# Patient Record
Sex: Female | Born: 1950 | Race: White | Hispanic: No | Marital: Married | Smoking: Never smoker
Health system: Southern US, Community
[De-identification: ages and names within clinical notes are randomized; demographics above are authoritative.]

## PROBLEM LIST (undated history)

## (undated) DIAGNOSIS — M12819 Other specific arthropathies, not elsewhere classified, unspecified shoulder: Secondary | ICD-10-CM

## (undated) DIAGNOSIS — Z973 Presence of spectacles and contact lenses: Secondary | ICD-10-CM

## (undated) DIAGNOSIS — R1310 Dysphagia, unspecified: Secondary | ICD-10-CM

## (undated) DIAGNOSIS — M75100 Unspecified rotator cuff tear or rupture of unspecified shoulder, not specified as traumatic: Secondary | ICD-10-CM

## (undated) DIAGNOSIS — C187 Malignant neoplasm of sigmoid colon: Secondary | ICD-10-CM

## (undated) DIAGNOSIS — R1110 Vomiting, unspecified: Secondary | ICD-10-CM

## (undated) DIAGNOSIS — R499 Unspecified voice and resonance disorder: Secondary | ICD-10-CM

## (undated) DIAGNOSIS — C210 Malignant neoplasm of anus, unspecified: Secondary | ICD-10-CM

## (undated) DIAGNOSIS — K219 Gastro-esophageal reflux disease without esophagitis: Secondary | ICD-10-CM

## (undated) DIAGNOSIS — I10 Essential (primary) hypertension: Secondary | ICD-10-CM

## (undated) DIAGNOSIS — J189 Pneumonia, unspecified organism: Secondary | ICD-10-CM

## (undated) DIAGNOSIS — E785 Hyperlipidemia, unspecified: Secondary | ICD-10-CM

## (undated) DIAGNOSIS — R053 Chronic cough: Secondary | ICD-10-CM

## (undated) DIAGNOSIS — R350 Frequency of micturition: Secondary | ICD-10-CM

## (undated) DIAGNOSIS — M1990 Unspecified osteoarthritis, unspecified site: Secondary | ICD-10-CM

## (undated) DIAGNOSIS — C801 Malignant (primary) neoplasm, unspecified: Secondary | ICD-10-CM

## (undated) DIAGNOSIS — I509 Heart failure, unspecified: Secondary | ICD-10-CM

## (undated) DIAGNOSIS — G629 Polyneuropathy, unspecified: Secondary | ICD-10-CM

## (undated) DIAGNOSIS — R05 Cough: Secondary | ICD-10-CM

## (undated) DIAGNOSIS — G44009 Cluster headache syndrome, unspecified, not intractable: Secondary | ICD-10-CM

## (undated) HISTORY — DX: Malignant (primary) neoplasm, unspecified: C80.1

## (undated) HISTORY — PX: COLON SURGERY: SHX602

## (undated) HISTORY — DX: Hyperlipidemia, unspecified: E78.5

## (undated) HISTORY — DX: Gastro-esophageal reflux disease without esophagitis: K21.9

## (undated) HISTORY — DX: Unspecified voice and resonance disorder: R49.9

## (undated) HISTORY — DX: Cluster headache syndrome, unspecified, not intractable: G44.009

## (undated) HISTORY — PX: CHOLECYSTECTOMY: SHX55

## (undated) HISTORY — PX: EYE SURGERY: SHX253

## (undated) HISTORY — DX: Essential (primary) hypertension: I10

## (undated) HISTORY — DX: Malignant neoplasm of anus, unspecified: C21.0

## (undated) HISTORY — DX: Chronic cough: R05.3

## (undated) HISTORY — DX: Malignant neoplasm of sigmoid colon: C18.7

## (undated) HISTORY — DX: Heart failure, unspecified: I50.9

## (undated) HISTORY — DX: Cough: R05

## (undated) HISTORY — PX: HERNIA REPAIR: SHX51

## (undated) HISTORY — DX: Unspecified osteoarthritis, unspecified site: M19.90

## (undated) HISTORY — DX: Vomiting, unspecified: R11.10

## (undated) HISTORY — DX: Dysphagia, unspecified: R13.10

## (undated) HISTORY — PX: ABDOMINAL HYSTERECTOMY: SHX81

---

## 1989-06-11 DIAGNOSIS — C210 Malignant neoplasm of anus, unspecified: Secondary | ICD-10-CM

## 1989-06-11 HISTORY — DX: Malignant neoplasm of anus, unspecified: C21.0

## 1990-06-11 DIAGNOSIS — C187 Malignant neoplasm of sigmoid colon: Secondary | ICD-10-CM

## 1990-06-11 HISTORY — DX: Malignant neoplasm of sigmoid colon: C18.7

## 2001-02-25 ENCOUNTER — Encounter: Payer: Self-pay | Admitting: Emergency Medicine

## 2001-02-25 ENCOUNTER — Emergency Department (HOSPITAL_COMMUNITY): Admission: EM | Admit: 2001-02-25 | Discharge: 2001-02-25 | Payer: Self-pay | Admitting: Emergency Medicine

## 2003-02-25 ENCOUNTER — Encounter: Payer: Self-pay | Admitting: Urology

## 2003-02-25 ENCOUNTER — Ambulatory Visit (HOSPITAL_COMMUNITY): Admission: RE | Admit: 2003-02-25 | Discharge: 2003-02-25 | Payer: Self-pay | Admitting: Urology

## 2003-04-02 ENCOUNTER — Encounter: Payer: Self-pay | Admitting: Preventative Medicine

## 2003-04-02 ENCOUNTER — Ambulatory Visit (HOSPITAL_COMMUNITY): Admission: RE | Admit: 2003-04-02 | Discharge: 2003-04-02 | Payer: Self-pay | Admitting: Preventative Medicine

## 2003-08-22 ENCOUNTER — Emergency Department (HOSPITAL_COMMUNITY): Admission: EM | Admit: 2003-08-22 | Discharge: 2003-08-22 | Payer: Self-pay | Admitting: Emergency Medicine

## 2005-04-03 ENCOUNTER — Ambulatory Visit (HOSPITAL_COMMUNITY): Admission: RE | Admit: 2005-04-03 | Discharge: 2005-04-03 | Payer: Self-pay | Admitting: Preventative Medicine

## 2005-04-30 ENCOUNTER — Encounter: Admission: RE | Admit: 2005-04-30 | Discharge: 2005-04-30 | Payer: Self-pay | Admitting: Neurology

## 2005-05-18 ENCOUNTER — Emergency Department (HOSPITAL_COMMUNITY): Admission: EM | Admit: 2005-05-18 | Discharge: 2005-05-18 | Payer: Self-pay | Admitting: *Deleted

## 2005-11-10 ENCOUNTER — Emergency Department (HOSPITAL_COMMUNITY): Admission: EM | Admit: 2005-11-10 | Discharge: 2005-11-10 | Payer: Self-pay | Admitting: Emergency Medicine

## 2006-03-11 ENCOUNTER — Ambulatory Visit (HOSPITAL_COMMUNITY): Admission: RE | Admit: 2006-03-11 | Discharge: 2006-03-11 | Payer: Self-pay | Admitting: Family Medicine

## 2006-11-20 ENCOUNTER — Inpatient Hospital Stay (HOSPITAL_COMMUNITY): Admission: RE | Admit: 2006-11-20 | Discharge: 2006-11-24 | Payer: Self-pay | Admitting: Neurosurgery

## 2007-02-27 ENCOUNTER — Encounter (HOSPITAL_COMMUNITY): Admission: RE | Admit: 2007-02-27 | Discharge: 2007-03-11 | Payer: Self-pay | Admitting: Neurosurgery

## 2008-05-24 ENCOUNTER — Encounter: Payer: Self-pay | Admitting: Orthopedic Surgery

## 2008-05-24 ENCOUNTER — Ambulatory Visit (HOSPITAL_COMMUNITY): Admission: RE | Admit: 2008-05-24 | Discharge: 2008-05-24 | Payer: Self-pay | Admitting: Family Medicine

## 2008-05-26 ENCOUNTER — Ambulatory Visit: Payer: Self-pay | Admitting: Orthopedic Surgery

## 2008-05-26 DIAGNOSIS — S8000XA Contusion of unspecified knee, initial encounter: Secondary | ICD-10-CM

## 2008-06-01 ENCOUNTER — Encounter: Payer: Self-pay | Admitting: Orthopedic Surgery

## 2008-06-11 HISTORY — PX: BACK SURGERY: SHX140

## 2008-09-01 ENCOUNTER — Inpatient Hospital Stay (HOSPITAL_COMMUNITY): Admission: RE | Admit: 2008-09-01 | Discharge: 2008-09-04 | Payer: Self-pay | Admitting: Neurosurgery

## 2008-11-09 ENCOUNTER — Ambulatory Visit (HOSPITAL_COMMUNITY): Admission: RE | Admit: 2008-11-09 | Discharge: 2008-11-09 | Payer: Self-pay | Admitting: Neurosurgery

## 2008-12-22 ENCOUNTER — Encounter: Admission: RE | Admit: 2008-12-22 | Discharge: 2008-12-22 | Payer: Self-pay | Admitting: Gastroenterology

## 2010-03-27 ENCOUNTER — Encounter: Admission: RE | Admit: 2010-03-27 | Discharge: 2010-03-27 | Payer: Self-pay | Admitting: Gastroenterology

## 2010-05-04 IMAGING — CR DG CHEST 2V
2 series · 2 of 2 positions shown · non-contrast
Comparison: 11/14/2006.

CLINICAL DATA: 58-year-old female preoperative study for lumbar
fusion.

CHEST - 2 VIEW

[view not recorded (1 of 2)]
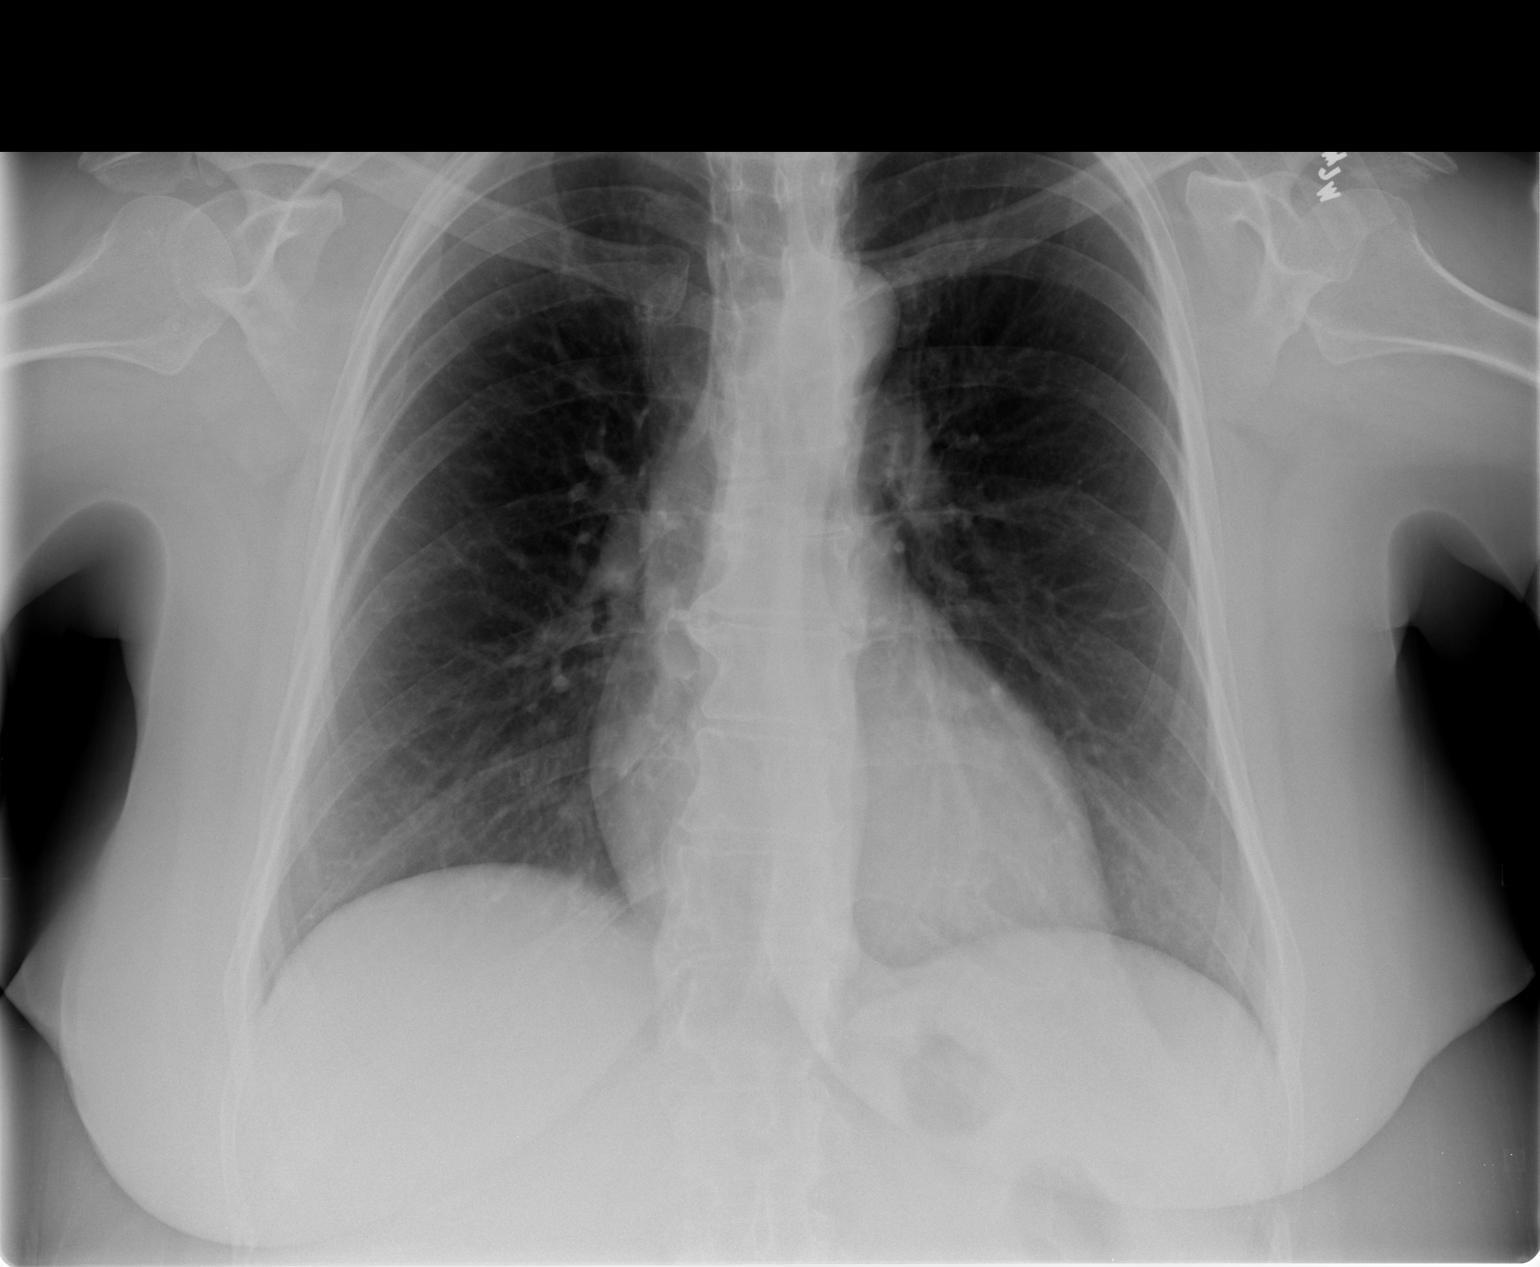

[view not recorded (2 of 2)]
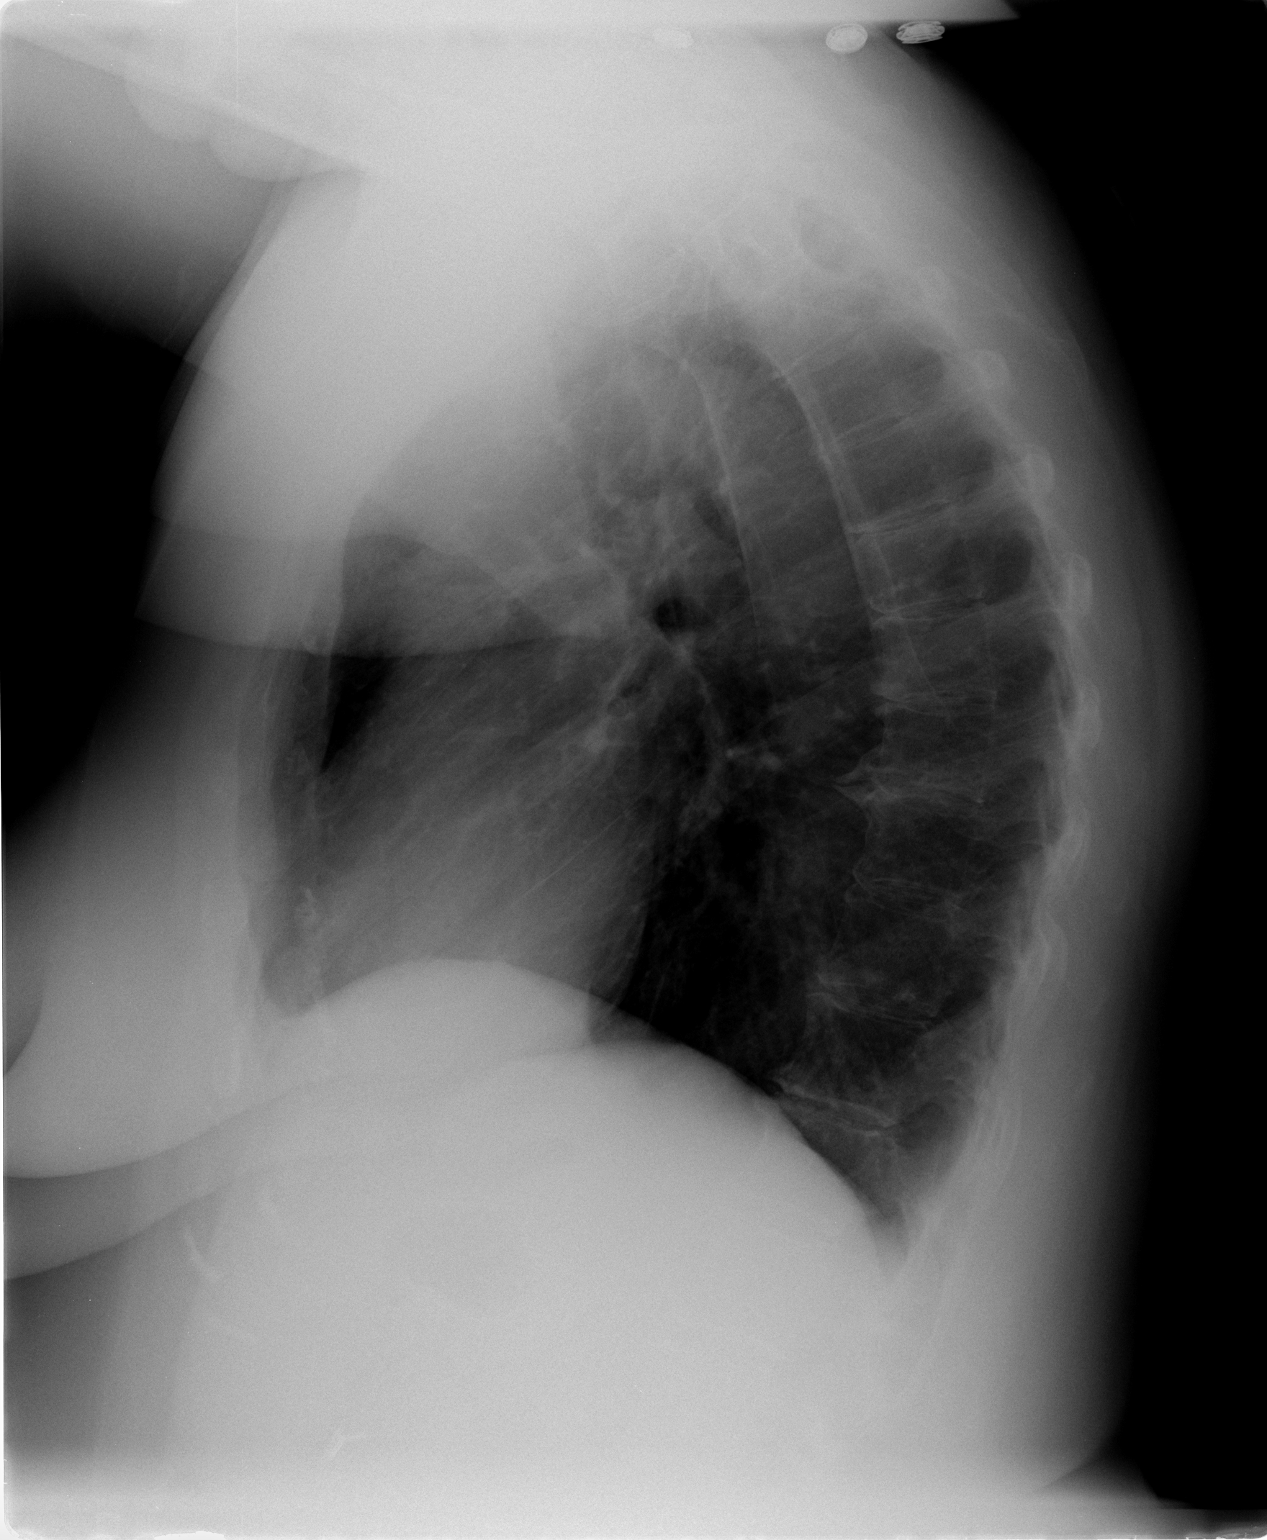

[2 of 2 positions shown; findings below may reference images not displayed]

FINDINGS: Stable relatively large lung volumes.  Cardiac size and
mediastinal contours are within normal limits.  Visualized tracheal
air column is within normal limits.  No pneumothorax, pulmonary
edema, pleural effusion or acute airspace opacity. Stable
visualized osseous structures.
IMPRESSION: No acute cardiopulmonary abnormality.

## 2010-08-14 ENCOUNTER — Ambulatory Visit (HOSPITAL_COMMUNITY)
Admission: RE | Admit: 2010-08-14 | Discharge: 2010-08-14 | Disposition: A | Discharge status: Home / Self Care | Payer: 59 | Source: Ambulatory Visit | Attending: Otolaryngology | Admitting: Otolaryngology

## 2010-08-14 DIAGNOSIS — K219 Gastro-esophageal reflux disease without esophagitis: Secondary | ICD-10-CM | POA: Insufficient documentation

## 2010-08-15 ENCOUNTER — Ambulatory Visit (HOSPITAL_COMMUNITY): Payer: 59

## 2010-08-16 ENCOUNTER — Ambulatory Visit
Admit: 2010-08-16 | Discharge: 2010-08-16 | Disposition: A | Discharge status: Home / Self Care | Payer: 59 | Attending: Otolaryngology | Admitting: Otolaryngology

## 2010-08-16 ENCOUNTER — Ambulatory Visit
Admission: RE | Admit: 2010-08-16 | Discharge: 2010-08-16 | Disposition: A | Discharge status: Home / Self Care | Payer: 59 | Source: Ambulatory Visit | Attending: Otolaryngology | Admitting: Otolaryngology

## 2010-08-17 ENCOUNTER — Other Ambulatory Visit: Payer: Self-pay | Admitting: Otolaryngology

## 2010-08-17 ENCOUNTER — Ambulatory Visit (HOSPITAL_COMMUNITY): Admission: RE | Admit: 2010-08-17 | Payer: 59 | Source: Ambulatory Visit

## 2010-08-17 MED ORDER — GADOBENATE DIMEGLUMINE 529 MG/ML IV SOLN
20.0000 mL | Freq: Once | INTRAVENOUS | Status: AC | PRN
  Administered 2010-08-17: 20 mL via INTRAVENOUS

## 2010-09-21 LAB — URINALYSIS, ROUTINE W REFLEX MICROSCOPIC
Bilirubin Urine: NEGATIVE
Hgb urine dipstick: NEGATIVE
Protein, ur: NEGATIVE mg/dL
Urobilinogen, UA: 0.2 mg/dL (ref 0.0–1.0)

## 2010-09-21 LAB — URINE MICROSCOPIC-ADD ON

## 2010-09-21 LAB — CBC
MCV: 86.1 fL (ref 78.0–100.0)
RBC: 4.81 MIL/uL (ref 3.87–5.11)
WBC: 8.9 10*3/uL (ref 4.0–10.5)

## 2010-09-21 LAB — BASIC METABOLIC PANEL
Chloride: 102 mEq/L (ref 96–112)
Creatinine, Ser: 0.67 mg/dL (ref 0.4–1.2)
GFR calc Af Amer: >60 mL/min (ref >60)
Potassium: 4.1 mEq/L (ref 3.5–5.1)

## 2010-09-21 LAB — GLUCOSE, CAPILLARY: Glucose-Capillary: 112 mg/dL — ABNORMAL HIGH (ref 70–99)

## 2010-10-24 NOTE — Op Note (Signed)
NAMEMarland Kitchen  [name], [name] NO.:  [id number]   MEDICAL RECORD NO.:  [id number]          PATIENT TYPE:  INP   LOCATION:  3172                         FACILITY:  [facility]   PHYSICIAN:  Coletta Memos, M.D.     DATE OF BIRTH:  1950/08/03   DATE OF PROCEDURE:  11/20/2006  DATE OF DISCHARGE:                               OPERATIVE REPORT   PREOPERATIVE DIAGNOSES:  1. Lumbar stenosis L4-5, L5-S1.  2. Lumbar spondylolisthesis, acquired, L4-5.  3. Lumbar spondylosis, L4-5, L5-S1.  4. Degenerative disk disease, L4-5, L5-S1   POSTOPERATIVE DIAGNOSES:  1. Lumbar stenosis L4-5, L5-S1.  2. Lumbar spondylolisthesis, acquired, L4-5.  3. Lumbar spondylosis, L4-5, L5-S1.  4. Degenerative disk disease, L4-5, L5-S1   PROCEDURES:  1. L4-5 posterior lumbar interbody arthrodesis with 11-mm PEEK      interbody cages filled with morcellized autograft, L5-S1 posterior      lumbar interbody arthrodesis with 11-mm PEEK implants filled with      morcellized autograft.  2. Posterolateral arthrodesis, L5-S1, with morcellized autograft.  3. Pedicle screw instrumentation, L4-S1, segmental.  4. Lumbar decompression beyond what was needed to place posterior      lumbar interbody arthrodeses at L4-5 and at L5-S1.   ANESTHESIA:  General endotracheal.   SURGEON:  Coletta Memos, M.D.   ASSISTANT:  Danae Orleans. Venetia Maxon, M.D.   INDICATIONS:  Lori Martinez is a woman who presented with a history of pain  that she has had in her back and bilateral lower extremities for many  years.  She said it had become quite bad over the last 6 months.  She is  a cleaner and a housewife, 63, and right-handed.  MRI showed severely  degenerated disks at L4-5 and L5-S1, significant anterolisthesis of L4  on L5, grade 1 and facet arthropathy at L4-5 and at L5-S1.  I therefore  recommended and she agreed to undergo operative decompression and  fixation.   OPERATIVE NOTE:  Lori Martinez was brought to the operating room, intubated  and placed under general anesthetic without difficulty.  A Foley  catheter was placed under sterile conditions.  She was then rolled prone  onto the table with body rolls.  All pressure points were properly  padded.  Her back was prepped and she was draped in a sterile fashion.  I infiltrated 40 mL 0.5% lidocaine with 1:200,000 strength epinephrine  into the lumbar region.  I then opened the skin with a #10 blade and  took this down to the thoracolumbar fascia sharply.  I then exposed the  laminae of L3, L4, L5 and S1 bilaterally.  Using intraoperative x-ray I  was able to localize.  I then proceeded with the lumbar decompression.   I decompressed the lumbar canal by performed laminectomies of L4 and L5  completely.  Just the most rostral portion of the sacrum was removed  using Kerrison punches.  The decompression was effected with a Leksell  rongeur and Kerrison punches.  I performed facetectomies of the inferior  facet of L4, inferior facet of L5 on the right side, partial  facetectomies on the left side.  The L4, L5 and S1 nerve roots were  thoroughly decompressed bilaterally using Kerrison punches.  I then  turned my attention to the disk spaces.  I performed diskectomies  bilaterally at L4-5 and at L5-S1.  This was done with Dr. Fredrich Birks  assistance.  I removed disk and prepared the endplates and vertebral  bodies for arthrodesis using various instruments.  When I achieved good  bony interface, I then sized the disk spaces and felt that 11-mm PEEK  implants would fit best.  I then placed two 11-mm PEEK implants at L4-5  and two 11-mm PEEK implants at L5-S1.  Both were filled with morcellized  autograft.  I then turned my attention to the pedicle screw placement.  I had already exposed the transverse processes of L4, L5 and the sacral  ala bilaterally.  Using fluoroscopy and a high-speed drill, I then  placed pedicle probes into the pedicles bilaterally of L4, L5 and -S1.  Screws were  placed without difficulty on both the right and left sides.  This was a Location manager.  After the screws were placed, I then  finished with the posterolateral arthrodesis.   I decorticated the transverse processes of L4, L5 and sacral ala on the  left side.  The transverse process of L4 on the  right side fractured so  I did not need to decorticate that.  I decorticated L5 and the sacral  ala.  I then placed morcellized autograft lateral to the screws over the  decorticated bony surfaces.  Having now completed the posterolateral  arthrodesis from L4 to S1, I completed my construct.  I placed two rods  and connected the screws on each side separately, L4 to S1.  Locking  screws were placed and then broken off.  X-ray showed the screws to be  in good position in both the AP and lateral planes.  I then irrigated  the wound.  I also packed more morcellized autograft into the disk  spaces around the cages at L4-5 and L5-S1 levels.  This was also  morcellized autograft.  I then closed the wound in layered fashion using  Vicryl sutures.  Dermabond was used for a sterile dressing.  The  thoracolumbar fascia was reapproximated, subcutaneous tissue,  subcuticular layers.  She tolerated the procedure well.           ______________________________  Coletta Memos, M.D.     KC/MEDQ  D:  11/20/2006  T:  11/21/2006  Job:  [id number]

## 2010-10-24 NOTE — Discharge Summary (Signed)
NAMEMarland Kitchen  DARNELLA, ZEITER NO.:  [id number]   MEDICAL RECORD NO.:  [id number]          PATIENT TYPE:  INP   LOCATION:  3014                         FACILITY:  [facility]   PHYSICIAN:  Payton Doughty, M.D.      DATE OF BIRTH:  1950/12/02   DATE OF ADMISSION:  11/20/2006  DATE OF DISCHARGE:  11/24/2006                               DISCHARGE SUMMARY   ADMITTING DIAGNOSIS:  Spinal stenosis L4-5, L5-S1.   DISCHARGE DIAGNOSES:  Spinal stenosis L4-5, L5-S1.   PROCEDURES:  L4-5, L5-S1 laminectomy, diskectomy, posterior lumbar  interbody fusion, posterolateral arthrodesis and segmental pedicle screw  instrumentation and lumbar decompression.   COMPLICATIONS:  None.   This is a 60 year old right-handed white lady whose history and physical  is recounted in the chart.  She has had spinal stenosis for sometime.  General exam was intact.  Neurologic exam showed L5 radiculopathy and  she was admitted after ascertaining normal laboratory value and  underwent fusion as noted above.  Postoperatively, she has done  reasonably well.  The first postop day her Foley was stopped.  It was  noticed that she had some blisters around her incision and a skin  consult was obtained that was helpful.  Postoperatively, she has  continued to do well.  She is currently up and about, eating and voiding  normally.  She can walk with a walker.  Her incision is now healing  well.  She is being discharged home to the care of her family.  Her  followup will be in the [facility] offices via phone call to Dr. Franky Macho  to arrange an appointment.           ______________________________  Payton Doughty, M.D.     MWR/MEDQ  D:  11/24/2006  T:  11/24/2006  Job:  [id number]

## 2010-10-24 NOTE — Op Note (Signed)
NAMEMarland Kitchen  CHISTINE, Lori Martinez NO.:  [id number]   MEDICAL RECORD NO.:  [id number]          PATIENT TYPE:  INP   LOCATION:  3027                         FACILITY:  [facility]   PHYSICIAN:  Coletta Memos, M.D.     DATE OF BIRTH:  July 24, 1950   DATE OF PROCEDURE:  09/01/2008  DATE OF DISCHARGE:                               OPERATIVE REPORT   PREOPERATIVE DIAGNOSES:  1. L3-4 stenosis.  2. Right L3-4 displaced disk.  3. Right L3-L4 radiculopathy.   POSTOPERATIVE DIAGNOSES:  1. L3-4 stenosis.  2. Right L3-4 displaced disk.  3. Right L3-L4 radiculopathy.   PROCEDURES:  1. Posterior lumbar interbody arthrodesis with 12 x 15-mm Opal cages      filled with morselized autograft.  2. Posterolateral arthrodesis, L3-L4, morselized autograft.  3. Segmental pedicle screw fixation, L3-S1.   COMPLICATIONS:  None.   SURGEON:  Coletta Memos, MD   ASSISTANT:  Stefani Dama, MD   ANESTHESIA:  General endotracheal.   INDICATIONS:  Lori Martinez is a 60 year old whom I had fuse in the past  from L4-S1.  She had complete fusions seen on CT.  She developed severe  pain in the right lower extremity.  MRI showed a disk herniation at L3-4  on the right side along with Modic changes present.  I therefore offered  and she agreed to undergo operative decompression and subsequent fusion  secondary to the Modic changes and the fact that it was fuse the level  below and the amount of stenosis.   OPERATIVE NOTE:  Lori Martinez was brought to the operating room, intubated,  and placed under general anesthetic.  Foley catheter placed under  sterile conditions.  She was rolled prone onto a Jackson table.  All  pressure points were properly padded.  Her back was prepped.  She was  draped in sterile fashion.  I infiltrated 20 mL of 0.5% lidocaine with  1:20,000 strength epinephrine into the subcutaneous tissue.  I opened  the skin with a #10 blade and took this down to the thoracolumbar  fascia.  I exposed the  lamina of L3 and of L4.  I also exposed the  pedicle screws in both sides.  I removed the caps and removed the  previous rods.  Having done that, I then proceeded with my decompression  by doing a near complete laminectomy of L3.  I fully decompressed the  spinal canal in both L3 and L4 nerve roots.  Having completed the  decompression, I then placed two 12 x 24-mm cages after doing the  diskectomy at L3-4.  The cages were placed without difficulty.  I then  turned my attention to the pedicle screw placement.  Pedicle screws were  then placed with Dr. Verlee Rossetti assistance at L3.  Rods were then  connected to S1 bilaterally.  This was done with fluoroscopic guidance.  Each hole was first probed, tapped, and probed again to ensure bony  integrity and then 65 x 45-mm Legacy screws were placed.   I then performed posterolateral arthrodesis at L3-L4 using morselized  autograft.  The cages were packed with morselized autograft.  Dr. Danielle Dess assisted  with that procedure.  This was done with fluoroscopy too.  I then closed  the wound in layered fashion using Vicryl sutures to reapproximate  thoracolumbar, subcutaneous, subcuticular layers with Dr. Verlee Rossetti  assistance.  Sterile dressing was applied.           ______________________________  Coletta Memos, M.D.     KC/MEDQ  D:  09/01/2008  T:  09/02/2008  Job:  [id number]

## 2010-10-24 NOTE — Discharge Summary (Signed)
NAMEMarland Kitchen  REEGAN, MCTIGHE NO.:  [id number]   MEDICAL RECORD NO.:  [id number]          PATIENT TYPE:  INP   LOCATION:  3027                         FACILITY:  [facility]   PHYSICIAN:  Payton Doughty, M.D.      DATE OF BIRTH:  08-17-1950   DATE OF ADMISSION:  09/01/2008  DATE OF DISCHARGE:  09/04/2008                               DISCHARGE SUMMARY   ADMITTING DIAGNOSES:  L3-4 stenosis, herniated disk at L3-4.   DISCHARGE DIAGNOSES:  L3-4 stenosis, herniated disk at L3-4.   OPERATIVE PROCEDURES:  L3-4 interbody fusion, L3-4 posterolateral  arthrodesis, and segmental pedicle screw fixation from L3-S1.   ATTENDING DOCTOR:  Coletta Memos, MD   COMPLICATIONS:  None.   DISCHARGE STATUS:  Alive and well.   BODY OF TEXT:  A 60 year old girl whose history and physical is  recounted in the chart by Dr. Franky Macho.  She had been previously fused  from L4-S1, had pain in her back, developed disk at L3-4, and was  admitted for fusion.  She was admitted ascertaining normal laboratory  values and underwent a L3-4 fusion.  Postoperatively, she has done well.  She is up and about, Foley was removed the first day.  PCA was stopped  the second day.  Just currently eating and voiding normally.  Her  husband has her prescriptions and she is being discharged home in the  care of her family.  Followup will be in the [facility] office, Dr.  Franky Macho by phone call.   .           ______________________________  Payton Doughty, M.D.     MWR/MEDQ  D:  09/04/2008  T:  09/04/2008  Job:  [id number]

## 2010-11-27 ENCOUNTER — Encounter (INDEPENDENT_AMBULATORY_CARE_PROVIDER_SITE_OTHER): Payer: Self-pay | Admitting: General Surgery

## 2011-01-17 ENCOUNTER — Encounter (INDEPENDENT_AMBULATORY_CARE_PROVIDER_SITE_OTHER): Payer: Self-pay

## 2011-01-22 ENCOUNTER — Ambulatory Visit (INDEPENDENT_AMBULATORY_CARE_PROVIDER_SITE_OTHER): Payer: Self-pay | Admitting: General Surgery

## 2011-02-05 ENCOUNTER — Encounter (INDEPENDENT_AMBULATORY_CARE_PROVIDER_SITE_OTHER): Payer: Self-pay | Admitting: General Surgery

## 2011-02-06 ENCOUNTER — Encounter (INDEPENDENT_AMBULATORY_CARE_PROVIDER_SITE_OTHER): Payer: Self-pay | Admitting: General Surgery

## 2011-02-06 ENCOUNTER — Ambulatory Visit (INDEPENDENT_AMBULATORY_CARE_PROVIDER_SITE_OTHER): Payer: 59 | Admitting: General Surgery

## 2011-02-06 VITALS — BP 126/84 | HR 58 | Temp 98.0°F

## 2011-02-06 DIAGNOSIS — K219 Gastro-esophageal reflux disease without esophagitis: Secondary | ICD-10-CM | POA: Insufficient documentation

## 2011-02-06 NOTE — Progress Notes (Signed)
Ms. Noble turns for 3 month followup of her hiatal hernia and gastroesophageal reflux disease.  Her symptoms are worse. Despite having a clearcut indication for laparoscopic gastrointestinal bypass, [employer] refused this. She wants to proceed with the fundoplication.  Physical exam Gen.-218 pounds, down 3 pounds in 2 months.  Abdomen-soft, obese, multiple scars are present.  Assessment: Hiatal hernia and gastroesophageal reflux disease refractory to medical treatment.  Plan: Laparoscopic possible open hiatal repair and Nissen fundoplication. The procedure and risks have been discussed previously.

## 2011-02-23 ENCOUNTER — Other Ambulatory Visit (INDEPENDENT_AMBULATORY_CARE_PROVIDER_SITE_OTHER): Payer: Self-pay | Admitting: General Surgery

## 2011-02-23 ENCOUNTER — Ambulatory Visit (HOSPITAL_COMMUNITY)
Admission: RE | Admit: 2011-02-23 | Discharge: 2011-02-23 | Disposition: A | Discharge status: Home / Self Care | Payer: 59 | Source: Ambulatory Visit | Attending: General Surgery | Admitting: General Surgery

## 2011-02-23 ENCOUNTER — Encounter (HOSPITAL_COMMUNITY): Payer: 59

## 2011-02-23 DIAGNOSIS — I10 Essential (primary) hypertension: Secondary | ICD-10-CM | POA: Insufficient documentation

## 2011-02-23 DIAGNOSIS — Z01810 Encounter for preprocedural cardiovascular examination: Secondary | ICD-10-CM | POA: Insufficient documentation

## 2011-02-23 DIAGNOSIS — Z01818 Encounter for other preprocedural examination: Secondary | ICD-10-CM | POA: Insufficient documentation

## 2011-02-23 DIAGNOSIS — R05 Cough: Secondary | ICD-10-CM | POA: Insufficient documentation

## 2011-02-23 DIAGNOSIS — Z01811 Encounter for preprocedural respiratory examination: Secondary | ICD-10-CM

## 2011-02-23 DIAGNOSIS — Z01812 Encounter for preprocedural laboratory examination: Secondary | ICD-10-CM | POA: Insufficient documentation

## 2011-02-23 DIAGNOSIS — R059 Cough, unspecified: Secondary | ICD-10-CM | POA: Insufficient documentation

## 2011-02-23 DIAGNOSIS — K219 Gastro-esophageal reflux disease without esophagitis: Secondary | ICD-10-CM | POA: Insufficient documentation

## 2011-02-23 DIAGNOSIS — K449 Diaphragmatic hernia without obstruction or gangrene: Secondary | ICD-10-CM | POA: Insufficient documentation

## 2011-02-23 DIAGNOSIS — R0789 Other chest pain: Secondary | ICD-10-CM | POA: Insufficient documentation

## 2011-02-23 LAB — COMPREHENSIVE METABOLIC PANEL
ALT: 22 U/L (ref 0–35)
AST: 19 U/L (ref 0–37)
Alkaline Phosphatase: 84 U/L (ref 39–117)
CO2: 29 mEq/L (ref 19–32)
Chloride: 103 mEq/L (ref 96–112)
GFR calc non Af Amer: >60 mL/min (ref >60)
Potassium: 3.9 mEq/L (ref 3.5–5.1)
Sodium: 140 mEq/L (ref 135–145)
Total Bilirubin: 0.3 mg/dL (ref 0.3–1.2)

## 2011-02-23 LAB — CBC
HCT: 40.7 % (ref 36.0–46.0)
Hemoglobin: 13.2 g/dL (ref 12.0–15.0)
MCH: 28.1 pg (ref 26.0–34.0)
MCHC: 32.4 g/dL (ref 30.0–36.0)
MCV: 86.6 fL (ref 78.0–100.0)
Platelets: 220 10*3/uL (ref 150–400)
RBC: 4.7 MIL/uL (ref 3.87–5.11)
RDW: 13.7 % (ref 11.5–15.5)
WBC: 7.7 10*3/uL (ref 4.0–10.5)

## 2011-02-23 LAB — DIFFERENTIAL
Basophils Absolute: 0 10*3/uL (ref 0.0–0.1)
Lymphocytes Relative: 32 % (ref 12–46)
Monocytes Relative: 5 % (ref 3–12)
Neutro Abs: 4.6 10*3/uL (ref 1.7–7.7)

## 2011-02-23 LAB — SURGICAL PCR SCREEN: Staphylococcus aureus: NEGATIVE

## 2011-02-23 LAB — PROTIME-INR
INR: 0.95 (ref 0.00–1.49)
Prothrombin Time: 12.9 seconds (ref 11.6–15.2)

## 2011-03-02 ENCOUNTER — Inpatient Hospital Stay (HOSPITAL_COMMUNITY)
Admission: AD | Admit: 2011-03-02 | Discharge: 2011-03-04 | DRG: 327 | Disposition: A | Discharge status: Home / Self Care | Payer: 59 | Source: Ambulatory Visit | Attending: General Surgery | Admitting: General Surgery

## 2011-03-02 DIAGNOSIS — E8881 Metabolic syndrome: Secondary | ICD-10-CM | POA: Diagnosis present

## 2011-03-02 DIAGNOSIS — K210 Gastro-esophageal reflux disease with esophagitis: Secondary | ICD-10-CM

## 2011-03-02 DIAGNOSIS — Z6841 Body Mass Index (BMI) 40.0 and over, adult: Secondary | ICD-10-CM

## 2011-03-02 DIAGNOSIS — M25519 Pain in unspecified shoulder: Secondary | ICD-10-CM | POA: Diagnosis present

## 2011-03-02 DIAGNOSIS — I10 Essential (primary) hypertension: Secondary | ICD-10-CM | POA: Diagnosis present

## 2011-03-02 DIAGNOSIS — K219 Gastro-esophageal reflux disease without esophagitis: Secondary | ICD-10-CM | POA: Diagnosis present

## 2011-03-02 DIAGNOSIS — K449 Diaphragmatic hernia without obstruction or gangrene: Secondary | ICD-10-CM

## 2011-03-02 DIAGNOSIS — E119 Type 2 diabetes mellitus without complications: Secondary | ICD-10-CM | POA: Diagnosis present

## 2011-03-02 DIAGNOSIS — R51 Headache: Secondary | ICD-10-CM | POA: Diagnosis present

## 2011-03-02 DIAGNOSIS — D62 Acute posthemorrhagic anemia: Secondary | ICD-10-CM | POA: Diagnosis not present

## 2011-03-02 DIAGNOSIS — E876 Hypokalemia: Secondary | ICD-10-CM | POA: Diagnosis not present

## 2011-03-02 HISTORY — PX: LAPAROSCOPIC NISSEN FUNDOPLICATION: SHX1932

## 2011-03-02 LAB — GLUCOSE, CAPILLARY: Glucose-Capillary: 131 mg/dL — ABNORMAL HIGH (ref 70–99)

## 2011-03-03 ENCOUNTER — Inpatient Hospital Stay (HOSPITAL_COMMUNITY): Payer: 59

## 2011-03-03 DIAGNOSIS — E119 Type 2 diabetes mellitus without complications: Secondary | ICD-10-CM

## 2011-03-03 LAB — BASIC METABOLIC PANEL
CO2: 27 mEq/L (ref 19–32)
Calcium: 8.3 mg/dL — ABNORMAL LOW (ref 8.4–10.5)
Chloride: 102 mEq/L (ref 96–112)
Creatinine, Ser: 0.55 mg/dL (ref 0.50–1.10)
GFR calc Af Amer: >60 mL/min (ref >60)
Sodium: 135 mEq/L (ref 135–145)

## 2011-03-03 LAB — GLUCOSE, CAPILLARY
Glucose-Capillary: 134 mg/dL — ABNORMAL HIGH (ref 70–99)
Glucose-Capillary: 134 mg/dL — ABNORMAL HIGH (ref 70–99)
Glucose-Capillary: 138 mg/dL — ABNORMAL HIGH (ref 70–99)
Glucose-Capillary: 140 mg/dL — ABNORMAL HIGH (ref 70–99)
Glucose-Capillary: 140 mg/dL — ABNORMAL HIGH (ref 70–99)

## 2011-03-03 LAB — CBC
MCV: 87.1 fL (ref 78.0–100.0)
Platelets: 200 10*3/uL (ref 150–400)
RBC: 3.73 MIL/uL — ABNORMAL LOW (ref 3.87–5.11)
RDW: 13.8 % (ref 11.5–15.5)
WBC: 7.9 10*3/uL (ref 4.0–10.5)

## 2011-03-03 MED ORDER — IOHEXOL 300 MG/ML  SOLN
50.0000 mL | Freq: Once | INTRAMUSCULAR | Status: AC | PRN
  Administered 2011-03-03: 50 mL via ORAL

## 2011-03-04 LAB — GLUCOSE, CAPILLARY
Glucose-Capillary: 125 mg/dL — ABNORMAL HIGH (ref 70–99)
Glucose-Capillary: 128 mg/dL — ABNORMAL HIGH (ref 70–99)
Glucose-Capillary: 93 mg/dL (ref 70–99)

## 2011-03-04 LAB — BASIC METABOLIC PANEL
Calcium: 8.4 mg/dL (ref 8.4–10.5)
Creatinine, Ser: 0.47 mg/dL — ABNORMAL LOW (ref 0.50–1.10)
GFR calc Af Amer: >60 mL/min (ref >60)
Sodium: 137 mEq/L (ref 135–145)

## 2011-03-04 LAB — CBC
Platelets: 197 10*3/uL (ref 150–400)
RBC: 3.45 MIL/uL — ABNORMAL LOW (ref 3.87–5.11)
RDW: 14 % (ref 11.5–15.5)
WBC: 8.4 10*3/uL (ref 4.0–10.5)

## 2011-03-05 LAB — POCT I-STAT 4, (NA,K, GLUC, HGB,HCT)
Potassium: 3.5 mEq/L (ref 3.5–5.1)
Sodium: 137 mEq/L (ref 135–145)

## 2011-03-06 LAB — TYPE AND SCREEN
ABO/RH(D): O POS
Unit division: 0

## 2011-03-21 ENCOUNTER — Encounter (INDEPENDENT_AMBULATORY_CARE_PROVIDER_SITE_OTHER): Payer: Self-pay | Admitting: General Surgery

## 2011-03-21 ENCOUNTER — Ambulatory Visit (INDEPENDENT_AMBULATORY_CARE_PROVIDER_SITE_OTHER): Payer: 59 | Admitting: General Surgery

## 2011-03-21 VITALS — BP 126/88 | HR 64 | Temp 97.8°F | Resp 20 | Ht 63.0 in | Wt 204.1 lb

## 2011-03-21 DIAGNOSIS — K219 Gastro-esophageal reflux disease without esophagitis: Secondary | ICD-10-CM

## 2011-03-21 NOTE — Patient Instructions (Signed)
You [address]. Continue activity restrictions. Stay on level II diet until you have no difficulty swallowing, then advance to level III diet.

## 2011-03-21 NOTE — Progress Notes (Signed)
Operation:  Laparoscopic hiatal hernia repair and Nissen fundoplication.  Date:  March 02, 2011.  Pathology:  na  HPI:  Lori Martinez is here for her first postoperative visit. Lori is on a level II diet with minimal dysphagia. No heartburn.   Physical Exam:  General-overweight female in no acute distress.  Abdomen-all incisions are clean, dry, and intact.   Assessment:  Progressing well postoperatively.  Plan:  Continue activity restrictions. Lori Martinez drive. Continued level II diet until dysphagia resolves and advance to level III diet.  Return visit one month.

## 2011-03-23 NOTE — Op Note (Signed)
NAMEMarland Kitchen  EMMILYN, CROOKE NO.:  [id number]  MEDICAL RECORD NO.:  [id number]  LOCATION:  DAYL                         [facility]ACILITY:  [facility]  PHYSICIAN:  Adolph Pollack, M.D.DATE OF BIRTH:  19-Dec-1950  DATE OF PROCEDURE:  03/02/2011 DATE OF DISCHARGE:                              OPERATIVE REPORT   PREOPERATIVE DIAGNOSES:  Medically refractory gastroesophageal reflux as well as hiatal hernia.  POSTOPERATIVE DIAGNOSES:  Medically refractory gastroesophageal reflux as well as hiatal hernia.  PROCEDURE:  Laparoscopic hiatal hernia repair and Nissen fundoplication.  SURGEON:  Adolph Pollack, M.D.  ASSISTANT:  Wilmon Arms. Tsuei, M.D.  ANESTHESIA:  General.  INDICATIONS:  Ms. Bockrath is a 60 year old morbidly obese female who has progressively worsening symptomatic gastroesophageal reflux.  She also has a small hiatal hernia.  She does have components of the metabolic syndrome.  I referred her for potential consideration of laparoscopic gastric bypass and repair of the hiatal hernia as it was felt the result of that could be better.  However, [employer], her [employer], denied this.  She continues to have progressively increasing symptoms despite medical treatment and thus presents for the above procedure.  We discussed the procedure risks and aftercare preoperatively.  TECHNIQUE:  She was brought to the operating room, placed supine on the operating table, and general anesthetic was administered.  A Foley catheter was inserted.  The orogastric tube was inserted.  The abdominal wall was widely sterilely prepped and draped.  She has had a number of previous operations.  In the left upper quadrant, after placing her in a slight reverse Trendelenburg position, I made a 5 mm incision.  Using a 5-mm Optiview trocar I gained access to the peritoneal cavity and created pneumoperitoneum.  I visualized the area under the trocar and there was no evidence of  bleeding or organ injury.  There were a number of adhesions between the omentum and the anterior abdominal wall.  At the right upper quadrant, there were adhesions between the colon and the anterior abdominal wall as well.  I subsequently was able to find an area to place an 11-mm trocar to the left of the umbilicus.  I then performed some sharp adhesiolysis and also used a harmonic scalpel.  The tissues were very friable and bled quite a bit just with adhesiolysis.  I was able to control this with the harmonic scalpel.  I ended up being able to divide the adhesions between the abdominal wall and omentum to allow for the upper abdomen to be free of adhesions.  This took approximately 45 minutes.  There was still some bleeding from the omentum, which I was able to stop with the harmonic scalpel.  Blood loss from this was approximately 200 to 300 mL.  Following this, I then placed an 11-mm trocar in the upper abdomen just to the right of the midline and a 5-mm trocar in the right upper quadrant.  A second 5-mm trocar was placed in the left upper quadrant. I made a 5-mm incision in the subxiphoid area and inserted a self- retaining liver retractor there and retracted the left lobe of the liver.  There was an  adhesion between the gastrohepatic omentum and left lobe of the liver, I took this down with the harmonic scalpel.  I then began dividing the thin gastrohepatic ligament up toward the right crus and divided the gastrophrenic ligament anteriorly using the harmonic scalpel.  Using blunt dissection I separated the right crus from the gastroesophageal junction.  I then approached the short gastric vessels and began at the mid fundus and divided these up to the left crural area.  Using blunt dissection, I was able to create a retroesophageal window.  A small hiatal hernia was noted.  I then closed the small hiatal hernia with the size 0 pledgeted nonabsorbable sutures.  I used two of these  sutures, which provided for adequate closure.  Following this, I was then able to pass the fundus through the retroesophageal window and fashion a 360 degree fundoplication.  A size 56 dilator was then placed down the esophagus and into the stomach under laparoscopic vision.  I then noted a fair amount of bleeding coming from the liver and noted that the 11-mm trocar in the epigastric area had caused a laceration in the liver.  I subsequently controlled this with electrocautery and packed it with Surgicel and held direct pressure.  However, this resulted in approximately 300 to 350 more mL of blood loss.  Once I had this under control, I then performed a 360 degree fundoplication.  The first 2 sutures incorporated a left leaf of the wrap, the esophagus and the right leaf of the wrap.  The most proximal suture pulled through the longitudinal layer of the esophagus muscle.  The inferior suture incorporated just the left leaf of the wrap and the right leaf of the wrap.  There was a 2.5 cm wrap.  I removed the dilator.  The wrap was floppy and under no tension.  I then anchored the wrap to the diaphragm, also reapproximated the longitudinal esophageal muscle in an area with a single 0 nonabsorbable suture.  This provided for an anterior buttress over the repair of the longitudinal esophageal muscle.  I then inspected all areas and evacuated old clot and old blood.  I saw no other evidence of bleeding.  I inspected all areas of lysis of adhesions and all areas where dissection was performed.  There was no evidence of intestinal injury.  The  liver was hemostatic at this time. The spleen was without evidence of bleeding.  Final blood loss was 800 mL.  I then released the liver retractor.  I removed all the trocars and released the pneumoperitoneum.  All skin incisions were closed with 4-0 Monocryl subcuticular stitches. Steri-Strips and sterile dressings were applied.  She tolerated the  procedure well.  She was taken to the recovery room in satisfactory condition.     Adolph Pollack, M.D.     Kari Baars  D:  03/02/2011  T:  03/02/2011  Job:  [id number]  Electronically Signed by Avel Peace M.D. on 03/23/2011 01:37:00 PM

## 2011-03-29 LAB — BASIC METABOLIC PANEL
BUN: 15
CO2: 27
Chloride: 102
Glucose, Bld: 92
Potassium: 3.5

## 2011-03-29 LAB — CBC
HCT: 39.2
MCHC: 33.7
MCV: 84
Platelets: 270
RDW: 13.2
WBC: 8

## 2011-03-29 LAB — ABO/RH: ABO/RH(D): O POS

## 2011-03-31 NOTE — Discharge Summary (Signed)
  NAMEMarland Kitchen  JOSELIN, CRANDELL NO.:  [id number]  MEDICAL RECORD NO.:  [id number]  LOCATION:  1535                         FACILITY:  [facility]  PHYSICIAN:  Adolph Pollack, M.D.DATE OF BIRTH:  05-Jun-1951  DATE OF ADMISSION:  03/02/2011 DATE OF DISCHARGE:  03/04/2011                              DISCHARGE SUMMARY   FINAL DIAGNOSIS:  Gastroesophageal reflux disease.  SECONDARY DIAGNOSES: 1. Hiatal hernia. 2. Obesity. 3. Hypertension. 4. Type 2 diabetes mellitus.  PROCEDURE:  Laparoscopic Nissen fundoplication and hiatal hernia repair.  REASON FOR ADMISSION:  This is a 60 year old female who has medically refractory gastroesophageal reflux disease with a small hiatal hernia. Bariatric surgery was recommended to her, but her insurance company denied it and she is thus admitted for laparoscopic hiatal hernia repair and Nissen fundoplication.  HOSPITAL COURSE:  She underwent the laparoscopic hiatal hernia repair and Nissen fundoplication on March 02, 2011, tolerated this  well.  On her 1st postoperative day, she had an upper GI study which demonstrated no leak and she was started on a full-liquid diet.  She had a little bit of nausea with that, but by her 2nd day, her nausea had improved.  She had some mild hypokalemia that resolved.  She had some acute blood loss anemia with hemoglobin down to 9.8, but she was asymptomatic.  It was felt that she could be discharged.  DISPOSITION:  Discharged home in satisfactory condition on March 04, 2011.  She was given specific activity restrictions as well as specific dietary instructions along with literature on this.  She will return on to taking her home medications except she can stop the Dexilant.  Pain medicine prescription was given to her.  She will return for office visit in approximately 2 to 3 weeks.       Adolph Pollack, M.D.     Kari Baars  D:  03/29/2011  T:  03/29/2011  Job:   [id number]  Electronically Signed by Avel Peace M.D. on 03/31/2011 01:05:08 AM

## 2011-03-31 NOTE — H&P (Signed)
  NAMEMarland Kitchen  Lori Martinez, Lori Martinez NO.:  [id number]  MEDICAL RECORD NO.:  [id number]  LOCATION:  1535                         FACILITY:  [facility]  PHYSICIAN:  Adolph Pollack, M.D.DATE OF BIRTH:  January 28, 1951  DATE OF ADMISSION:  03/02/2011 DATE OF DISCHARGE:  03/04/2011                             HISTORY & PHYSICAL   REASON:  Elective hiatal hernia repair and Nissen fundoplication.  HISTORY:  This is a 60 year old female who has had progressively worsening symptomatic gastroesophageal reflux and a small hiatal hernia. She also has components of the metabolic syndrome.  It was felt that she would be an excellent candidate for laparoscopic gastric bypass and hernia repair to help resolve all these problems, however, her insurance company denied that.  She has lost some weight.  She wants to proceed with the above operation.  I discussed with her that given her size, the success rate was not quite as high and she understands this.  PAST MEDICAL HISTORY: 1. Obesity. 2. Hypertension. 3. Diabetes mellitus. 4. Gastroesophageal reflux disease. 5. Hiatal hernia.  ALLERGIES:  Include PENICILLIN and SULFA.  MEDICATIONS AT HOME: 1. Voltaren. 2. Econazole nitrate. 3. Dexilant. 4. Gabapentin. 5. Ramipril. 6. Zetia.  SOCIAL HISTORY:  She is married.  No tobacco or alcohol use.  REVIEW OF SYSTEMS:  No fever or chills.  No cough, no vomiting.  PHYSICAL EXAMINATION:  GENERAL:  Obese female in no acute distress, pleasant and cooperative. VITAL SIGNS:  Temperature is 97.4, pulse 82, respiratory rate 16, blood pressure is 118/46, O2 sat is 96% on room air. EYES:  Extraocular motions intact.  No icterus. NECK:  Supple without mass. RESPIRATORY:  Breath sounds equal and clear.  Respirations unlabored. CARDIOVASCULAR:  Demonstrates regular rate, rhythm.  There is no murmur. ABDOMEN:  Demonstrates multiple well-healed scars without hernia. EXTREMITIES:  SCDs hose are on.  There  is no significant edema. SKIN:  No jaundice.  IMPRESSION:  Progressively symptomatic gastroesophageal reflux and hiatal hernia.  She is failing medical management.  PLAN:  Laparoscopic hiatal hernia repair and Nissen fundoplication. Procedure risks and aftercare have been discussed with her in detail preoperatively.     Adolph Pollack, M.D.     Kari Baars  D:  03/29/2011  T:  03/29/2011  Job:  [id number]  Electronically Signed by Avel Peace M.D. on 03/31/2011 01:05:17 AM

## 2011-04-24 ENCOUNTER — Ambulatory Visit (INDEPENDENT_AMBULATORY_CARE_PROVIDER_SITE_OTHER): Payer: 59 | Admitting: General Surgery

## 2011-04-24 VITALS — BP 144/80 | HR 80 | Temp 97.3°F | Resp 14 | Ht 63.0 in | Wt 194.6 lb

## 2011-04-24 DIAGNOSIS — Z9889 Other specified postprocedural states: Secondary | ICD-10-CM

## 2011-04-24 NOTE — Progress Notes (Signed)
Operation:  Laparoscopic hiatal hernia repair and Nissen fundoplication.  Date:  March 02, 2011.  Pathology:  na  HPI:  Lori Martinez is here for her second postoperative visit. She is on a level III diet with mild dysphagia   Physical Exam:  General-overweight female in no acute distress.  Abdomen-all incisions are clean, dry, and intact.   Assessment:  Some mild dysphagia with level III diet.  Plan:  Start resuming normal activities.  Advance to level IV diet once dysphagia with level III diet resolves.  Return visit in one month.

## 2011-04-24 NOTE — Patient Instructions (Signed)
Stay on level 3 diet until you no longer have difficulty swallowing.  Then start level 4 diet.  You Eblen start exercising again.

## 2011-05-28 ENCOUNTER — Encounter (INDEPENDENT_AMBULATORY_CARE_PROVIDER_SITE_OTHER): Payer: Self-pay

## 2011-05-29 ENCOUNTER — Encounter (INDEPENDENT_AMBULATORY_CARE_PROVIDER_SITE_OTHER): Payer: Self-pay | Admitting: General Surgery

## 2011-05-29 ENCOUNTER — Ambulatory Visit (INDEPENDENT_AMBULATORY_CARE_PROVIDER_SITE_OTHER): Payer: 59 | Admitting: General Surgery

## 2011-05-29 VITALS — BP 148/90 | HR 68 | Temp 97.5°F | Resp 20 | Ht 63.0 in | Wt 192.2 lb

## 2011-05-29 DIAGNOSIS — Z9889 Other specified postprocedural states: Secondary | ICD-10-CM

## 2011-05-29 NOTE — Patient Instructions (Signed)
Activities as tolerated.  Continue eating instructions as we discussed.

## 2011-05-29 NOTE — Progress Notes (Signed)
Operation:  Laparoscopic hiatal hernia repair and Nissen fundoplication.  Date:  March 02, 2011.  Pathology:  na  HPI:  Lori Martinez is here for her third postoperative visit. She is tolerating her diet without dysphagia.   Physical Exam:  General-overweight female in no acute distress.  Abdomen-all incisions are clean, dry, and intact with a slight rash around her periumbilical incision   Assessment:  Doing well with no dysphagia.  Plan:  Diet and activities as tolerated.  Hydrocortisone or benadryl cream to rash.  RTC 3 months.

## 2011-06-12 HISTORY — PX: COLONOSCOPY: SHX174

## 2011-08-27 ENCOUNTER — Ambulatory Visit (INDEPENDENT_AMBULATORY_CARE_PROVIDER_SITE_OTHER): Payer: 59 | Admitting: General Surgery

## 2011-08-27 ENCOUNTER — Encounter (INDEPENDENT_AMBULATORY_CARE_PROVIDER_SITE_OTHER): Payer: Self-pay | Admitting: General Surgery

## 2011-08-27 VITALS — BP 122/76 | HR 68 | Temp 96.9°F | Resp 18 | Ht 63.0 in | Wt 182.8 lb

## 2011-08-27 DIAGNOSIS — Z09 Encounter for follow-up examination after completed treatment for conditions other than malignant neoplasm: Secondary | ICD-10-CM

## 2011-08-27 NOTE — Progress Notes (Signed)
Operation:  Laparoscopic hiatal hernia repair and Nissen fundoplication.  Date:  March 02, 2011.  Pathology:  na  HPI:  Lori Martinez is here for long term follow up. She is tolerating her diet without dysphagia.  She denies heartburn. She does have a lot of flatulence. She has lost weight.   Physical Exam:  General-overweight female in no acute distress.  Abdomen-all incisions are clean, dry, and intact   Assessment:  Doing well long term after laparoscopic hiatal hernia repair and Nissen fundoplication in September 2012.  Plan:  Avoid overeating and significant weight gain. Avoid repetitive heavy lifting. Take Gas-X or charcoal tablets for excess flatulence. Return visit p.r.n.

## 2011-08-27 NOTE — Patient Instructions (Signed)
Avoid overeating and significant weight gain. Avoid repetitive heavy lifting. Take Gas-X or charcoal tablets for excess gas.

## 2011-09-26 ENCOUNTER — Telehealth: Payer: Self-pay

## 2011-09-26 NOTE — Telephone Encounter (Signed)
LMOM to call.

## 2011-10-17 NOTE — Telephone Encounter (Signed)
LMOM to call.

## 2011-10-17 NOTE — Telephone Encounter (Signed)
Letter to pt

## 2012-02-08 ENCOUNTER — Encounter (INDEPENDENT_AMBULATORY_CARE_PROVIDER_SITE_OTHER): Payer: Self-pay

## 2012-05-30 ENCOUNTER — Other Ambulatory Visit: Payer: Self-pay | Admitting: Neurosurgery

## 2012-05-30 DIAGNOSIS — M542 Cervicalgia: Secondary | ICD-10-CM

## 2012-05-30 DIAGNOSIS — M549 Dorsalgia, unspecified: Secondary | ICD-10-CM

## 2012-05-31 ENCOUNTER — Encounter (HOSPITAL_COMMUNITY): Payer: Self-pay | Admitting: *Deleted

## 2012-05-31 ENCOUNTER — Emergency Department (HOSPITAL_COMMUNITY)
Admission: EM | Admit: 2012-05-31 | Discharge: 2012-05-31 | Disposition: A | Discharge status: Home / Self Care | Payer: 59 | Attending: Emergency Medicine | Admitting: Emergency Medicine

## 2012-05-31 DIAGNOSIS — Z79899 Other long term (current) drug therapy: Secondary | ICD-10-CM | POA: Insufficient documentation

## 2012-05-31 DIAGNOSIS — R21 Rash and other nonspecific skin eruption: Secondary | ICD-10-CM | POA: Insufficient documentation

## 2012-05-31 DIAGNOSIS — Z8669 Personal history of other diseases of the nervous system and sense organs: Secondary | ICD-10-CM | POA: Insufficient documentation

## 2012-05-31 DIAGNOSIS — M129 Arthropathy, unspecified: Secondary | ICD-10-CM | POA: Insufficient documentation

## 2012-05-31 DIAGNOSIS — T7840XA Allergy, unspecified, initial encounter: Secondary | ICD-10-CM

## 2012-05-31 DIAGNOSIS — E785 Hyperlipidemia, unspecified: Secondary | ICD-10-CM | POA: Insufficient documentation

## 2012-05-31 DIAGNOSIS — Z8679 Personal history of other diseases of the circulatory system: Secondary | ICD-10-CM | POA: Insufficient documentation

## 2012-05-31 DIAGNOSIS — I509 Heart failure, unspecified: Secondary | ICD-10-CM | POA: Insufficient documentation

## 2012-05-31 DIAGNOSIS — I10 Essential (primary) hypertension: Secondary | ICD-10-CM | POA: Insufficient documentation

## 2012-05-31 DIAGNOSIS — Z859 Personal history of malignant neoplasm, unspecified: Secondary | ICD-10-CM | POA: Insufficient documentation

## 2012-05-31 DIAGNOSIS — K219 Gastro-esophageal reflux disease without esophagitis: Secondary | ICD-10-CM | POA: Insufficient documentation

## 2012-05-31 DIAGNOSIS — H9190 Unspecified hearing loss, unspecified ear: Secondary | ICD-10-CM | POA: Insufficient documentation

## 2012-05-31 DIAGNOSIS — E119 Type 2 diabetes mellitus without complications: Secondary | ICD-10-CM | POA: Insufficient documentation

## 2012-05-31 MED ORDER — PREDNISONE 20 MG PO TABS
40.0000 mg | ORAL_TABLET | Freq: Once | ORAL | Status: AC
  Administered 2012-05-31: 40 mg via ORAL
  Filled 2012-05-31: qty 1

## 2012-05-31 MED ORDER — DIPHENHYDRAMINE HCL 25 MG PO CAPS
50.0000 mg | ORAL_CAPSULE | Freq: Once | ORAL | Status: AC
  Administered 2012-05-31: 50 mg via ORAL
  Filled 2012-05-31: qty 2

## 2012-05-31 MED ORDER — FAMOTIDINE 20 MG PO TABS
20.0000 mg | ORAL_TABLET | Freq: Once | ORAL | Status: AC
  Administered 2012-05-31: 20 mg via ORAL
  Filled 2012-05-31: qty 1

## 2012-05-31 MED ORDER — PREDNISONE 20 MG PO TABS
40.0000 mg | ORAL_TABLET | Freq: Once | ORAL | Status: DC
  Filled 2012-05-31: qty 1

## 2012-05-31 MED ORDER — PREDNISONE 20 MG PO TABS: 40.0000 mg | ORAL_TABLET | Freq: Every day | ORAL | Status: DC

## 2012-05-31 NOTE — ED Provider Notes (Signed)
History     CSN: [id number]  Arrival date & time 05/31/12  2011   None     Chief Complaint  Patient presents with  . Rash    (Consider location/radiation/quality/duration/timing/severity/associated sxs/prior treatment) HPI Comments: Started breaking out in hives this PM.  No difficulty breathing or swallowing.  No identifiable allergen exposure.  Has not taken any meds to tx.   Patient is a 61 y.o. female presenting with rash. The history is provided by the patient. No language interpreter was used.  Rash  This is a new problem. The problem has been gradually worsening. The problem is associated with nothing. There has been no fever. Affected Location: neck down. She has tried nothing for the symptoms.    Past Medical History  Diagnosis Date  . Cancer   . Hyperlipidemia   . Hypertension   . Acid reflux   . Cataract   . Diabetes mellitus   . CHF (congestive heart failure)   . Arthritis     hands and knees  . Trouble swallowing   . Hearing loss   . Change in voice   . Chronic cough   . Chest pain   . Vomiting     as result of acid reflux   . Headaches, cluster     Past Surgical History  Procedure Date  . Abdominal hysterectomy   . Cholecystectomy   . Eye surgery   . Back surgery 2010  . Colon surgery 1990    colon/rectal cancer  . Hernia repair     hiatal hernia   . Laparoscopic nissen fundoplication 03/02/11    Family History  Problem Relation Age of Onset  . Stroke Mother   . Heart disease Father   . Heart disease Brother     History  Substance Use Topics  . Smoking status: Never Smoker   . Smokeless tobacco: Never Used  . Alcohol Use: No    OB History    Grav Para Term Preterm Abortions TAB SAB Ect Mult Living                  Review of Systems  Respiratory: Negative for shortness of breath, wheezing and stridor.   Skin: Positive for rash.  All other systems reviewed and are negative.    Allergies  Sulfonamide derivatives and  Penicillins  Home Medications   Current Outpatient Rx  Name  Route  Sig  Dispense  Refill  . CALCIUM-VITAMIN D 500-200 MG-UNIT PO TABS   Oral   Take 2 tablets by mouth daily. Pt takes 1200 mg qd         . EZETIMIBE 10 MG PO TABS   Oral   Take 10 mg by mouth daily.           Marland Kitchen GABAPENTIN 300 MG PO CAPS   Oral   Take 300 mg by mouth 3 (three) times daily.           . CENTRUM SILVER PO   Oral   Take by mouth daily.           Marland Kitchen RAMIPRIL 10 MG PO TABS   Oral   Take 20 mg by mouth daily. Pt reports taking 20 mg qd         . VOLTAREN 1 % TD GEL   Topical   Apply 2 g topically 4 (four) times daily as needed. Arthritic         . PREDNISONE 20 MG PO TABS  Oral   Take 2 tablets (40 mg total) by mouth daily.   10 tablet   0     BP 111/38  Pulse 81  Temp 97.9 F (36.6 C)  Resp 20  Ht 5\' 4"  (1.626 m)  Wt 195 lb (88.451 kg)  BMI 33.47 kg/m2  SpO2 100%  Physical Exam  Nursing note and vitals reviewed. Constitutional: She is oriented to person, place, and time. She appears well-developed and well-nourished. No distress.  HENT:  Head: Normocephalic and atraumatic.  Eyes: EOM are normal.  Neck: Normal range of motion.  Cardiovascular: Normal rate and regular rhythm.   Pulmonary/Chest: Effort normal and breath sounds normal.  Abdominal: Soft. She exhibits no distension. There is no tenderness.  Musculoskeletal: Normal range of motion.  Neurological: She is alert and oriented to person, place, and time.  Skin: Skin is warm and dry. Rash noted. Rash is urticarial. She is not diaphoretic. There is erythema. No pallor.  Psychiatric: She has a normal mood and affect. Judgment normal.    ED Course  Procedures (including critical care time)  Labs Reviewed - No data to display No results found.   1. Allergic reaction       MDM  rx-prednisone 40 mg QD x 6 days. Benadryl 50 mg QID pepcid 20 mg BID Return if any problems.        Evalina Field,  [place] 05/31/12 2230

## 2012-05-31 NOTE — ED Notes (Signed)
Pt c/o rash since 11am. Small red raised area all over body

## 2012-06-01 NOTE — ED Provider Notes (Signed)
Medical screening examination/treatment/procedure(s) were performed by non-physician practitioner and as supervising physician I was immediately available for consultation/collaboration.    Glynn Octave, MD 06/01/12 [phone]

## 2012-06-05 ENCOUNTER — Ambulatory Visit
Admission: RE | Admit: 2012-06-05 | Discharge: 2012-06-05 | Disposition: A | Discharge status: Home / Self Care | Payer: 59 | Source: Ambulatory Visit | Attending: Neurosurgery | Admitting: Neurosurgery

## 2012-06-05 VITALS — BP 139/70 | HR 72

## 2012-06-05 DIAGNOSIS — M542 Cervicalgia: Secondary | ICD-10-CM

## 2012-06-05 DIAGNOSIS — M549 Dorsalgia, unspecified: Secondary | ICD-10-CM

## 2012-06-05 MED ORDER — DIAZEPAM 5 MG PO TABS
10.0000 mg | ORAL_TABLET | Freq: Once | ORAL | Status: AC
  Administered 2012-06-05: 10 mg via ORAL

## 2012-06-05 MED ORDER — OXYCODONE-ACETAMINOPHEN 5-325 MG PO TABS
1.0000 | ORAL_TABLET | Freq: Once | ORAL | Status: AC
  Administered 2012-06-05: 1 via ORAL

## 2012-06-05 MED ORDER — ONDANSETRON HCL 4 MG/2ML IJ SOLN: 4.0000 mg | Freq: Four times a day (QID) | INTRAMUSCULAR | Status: DC | PRN

## 2012-06-05 MED ORDER — IOHEXOL 300 MG/ML  SOLN
10.0000 mL | Freq: Once | INTRAMUSCULAR | Status: AC | PRN
  Administered 2012-06-05: 10 mL via INTRATHECAL

## 2012-06-05 NOTE — Progress Notes (Signed)
Pt's husband called, discharge is at 10:00 am.

## 2014-04-14 ENCOUNTER — Ambulatory Visit (HOSPITAL_COMMUNITY)
Admission: RE | Admit: 2014-04-14 | Discharge: 2014-04-14 | Disposition: A | Discharge status: Home / Self Care | Payer: 59 | Source: Ambulatory Visit | Attending: Internal Medicine | Admitting: Internal Medicine

## 2014-04-14 ENCOUNTER — Other Ambulatory Visit (HOSPITAL_COMMUNITY): Payer: Self-pay | Admitting: Internal Medicine

## 2014-04-14 ENCOUNTER — Encounter (INDEPENDENT_AMBULATORY_CARE_PROVIDER_SITE_OTHER): Payer: Self-pay

## 2014-04-14 DIAGNOSIS — R079 Chest pain, unspecified: Secondary | ICD-10-CM | POA: Diagnosis not present

## 2014-04-19 ENCOUNTER — Other Ambulatory Visit (HOSPITAL_COMMUNITY): Payer: Self-pay | Admitting: Internal Medicine

## 2014-04-19 DIAGNOSIS — M5412 Radiculopathy, cervical region: Secondary | ICD-10-CM

## 2014-04-19 DIAGNOSIS — M898X1 Other specified disorders of bone, shoulder: Secondary | ICD-10-CM

## 2014-04-21 ENCOUNTER — Other Ambulatory Visit (HOSPITAL_COMMUNITY): Payer: 59

## 2014-04-22 ENCOUNTER — Other Ambulatory Visit (HOSPITAL_COMMUNITY): Payer: 59

## 2014-04-22 ENCOUNTER — Ambulatory Visit (HOSPITAL_COMMUNITY)
Admission: RE | Admit: 2014-04-22 | Discharge: 2014-04-22 | Disposition: A | Discharge status: Home / Self Care | Payer: 59 | Source: Ambulatory Visit | Attending: Internal Medicine | Admitting: Internal Medicine

## 2014-04-22 DIAGNOSIS — M542 Cervicalgia: Secondary | ICD-10-CM | POA: Insufficient documentation

## 2014-04-22 DIAGNOSIS — M25511 Pain in right shoulder: Secondary | ICD-10-CM | POA: Insufficient documentation

## 2014-04-22 DIAGNOSIS — M5412 Radiculopathy, cervical region: Secondary | ICD-10-CM

## 2014-04-22 DIAGNOSIS — M898X1 Other specified disorders of bone, shoulder: Secondary | ICD-10-CM

## 2014-07-12 ENCOUNTER — Other Ambulatory Visit: Payer: Self-pay | Admitting: Neurosurgery

## 2014-07-12 DIAGNOSIS — M48062 Spinal stenosis, lumbar region with neurogenic claudication: Secondary | ICD-10-CM

## 2014-07-12 DIAGNOSIS — G9519 Other vascular myelopathies: Secondary | ICD-10-CM

## 2014-07-23 ENCOUNTER — Ambulatory Visit
Admission: RE | Admit: 2014-07-23 | Discharge: 2014-07-23 | Disposition: A | Discharge status: Home / Self Care | Payer: 59 | Source: Ambulatory Visit | Attending: Neurosurgery | Admitting: Neurosurgery

## 2014-07-23 DIAGNOSIS — M48062 Spinal stenosis, lumbar region with neurogenic claudication: Secondary | ICD-10-CM

## 2014-07-23 DIAGNOSIS — G9519 Other vascular myelopathies: Secondary | ICD-10-CM

## 2014-07-23 MED ORDER — GADOBENATE DIMEGLUMINE 529 MG/ML IV SOLN
17.0000 mL | Freq: Once | INTRAVENOUS | Status: AC | PRN
Start: 2014-07-23 — End: 2014-07-23
  Administered 2014-07-23: 17 mL via INTRAVENOUS

## 2014-08-05 ENCOUNTER — Other Ambulatory Visit (HOSPITAL_COMMUNITY): Payer: Self-pay | Admitting: Neurosurgery

## 2014-08-05 DIAGNOSIS — M899 Disorder of bone, unspecified: Secondary | ICD-10-CM

## 2014-08-09 ENCOUNTER — Ambulatory Visit (HOSPITAL_COMMUNITY)

## 2014-08-09 ENCOUNTER — Encounter (HOSPITAL_COMMUNITY)
Admission: RE | Admit: 2014-08-09 | Discharge: 2014-08-09 | Disposition: A | Discharge status: Home / Self Care | Payer: 59 | Source: Ambulatory Visit | Attending: Neurosurgery | Admitting: Neurosurgery

## 2014-08-09 ENCOUNTER — Encounter (HOSPITAL_COMMUNITY)

## 2014-08-09 DIAGNOSIS — R0789 Other chest pain: Secondary | ICD-10-CM | POA: Diagnosis not present

## 2014-08-09 DIAGNOSIS — M899 Disorder of bone, unspecified: Secondary | ICD-10-CM | POA: Insufficient documentation

## 2014-08-09 DIAGNOSIS — M25511 Pain in right shoulder: Secondary | ICD-10-CM | POA: Diagnosis not present

## 2014-08-09 DIAGNOSIS — Z85038 Personal history of other malignant neoplasm of large intestine: Secondary | ICD-10-CM | POA: Diagnosis not present

## 2014-08-09 DIAGNOSIS — M545 Low back pain: Secondary | ICD-10-CM | POA: Diagnosis not present

## 2014-08-09 MED ORDER — TECHNETIUM TC 99M MEDRONATE IV KIT
25.0000 | PACK | Freq: Once | INTRAVENOUS | Status: AC | PRN
  Administered 2014-08-09: 25 via INTRAVENOUS

## 2014-09-03 ENCOUNTER — Other Ambulatory Visit: Payer: Self-pay | Admitting: Neurosurgery

## 2014-09-03 DIAGNOSIS — M542 Cervicalgia: Secondary | ICD-10-CM

## 2014-09-24 ENCOUNTER — Ambulatory Visit
Admission: RE | Admit: 2014-09-24 | Discharge: 2014-09-24 | Disposition: A | Discharge status: Home / Self Care | Payer: 59 | Source: Ambulatory Visit | Attending: Neurosurgery | Admitting: Neurosurgery

## 2014-09-24 DIAGNOSIS — M542 Cervicalgia: Secondary | ICD-10-CM

## 2014-09-28 ENCOUNTER — Other Ambulatory Visit (HOSPITAL_COMMUNITY): Payer: Self-pay | Admitting: Neurosurgery

## 2014-10-11 NOTE — Pre-Procedure Instructions (Signed)
Lori Martinez  10/11/2014   Your procedure is scheduled on:  Wed, Lori Martinez 11 @ 10:00 AM  Report to [facility] Entrance A  at 7:00 AM.  Call this number if you have problems the morning of surgery: [phone]   Remember:   Do not eat food or drink liquids after midnight.   Take these medicines the morning of surgery with A SIP OF WATER: Gabapentin(Neurontin) and Prednisone(Deltasone)              No Goody's,BC's,Aleve,Aspirin,Ibuprofen,Fish Oil,or any Herbal Medications.    Do not wear jewelry, make-up or nail polish.  Do not wear lotions, powders, or perfumes. You Lori Martinez wear deodorant.  Do not shave 48 hours prior to surgery.   Do not bring valuables to the hospital.  [name] is not responsible                  for any belongings or valuables.               Contacts, dentures or bridgework Lori Martinez not be worn into surgery.  Leave suitcase in the car. After surgery it Lori Martinez be brought to your room.  For patients admitted to the hospital, discharge time is determined by your                treatment team.                 Special Instructions:  Lori Martinez - Preparing for Surgery  Before surgery, you can play an important role.  Because skin is not sterile, your skin needs to be as free of germs as possible.  You can reduce the number of germs on you skin by washing with CHG (chlorahexidine gluconate) soap before surgery.  CHG is an antiseptic cleaner which kills germs and bonds with the skin to continue killing germs even after washing.  Please DO NOT use if you have an allergy to CHG or antibacterial soaps.  If your skin becomes reddened/irritated stop using the CHG and inform your nurse when you arrive at [facility].  Do not shave (including legs and underarms) for at least 48 hours prior to the first CHG shower.  You Lori Martinez shave your face.  Please follow these instructions carefully:   1.  Shower with CHG Soap the night before surgery and the                                morning of  Surgery.  2.  If you choose to wash your hair, wash your hair first as usual with your       normal shampoo.  3.  After you shampoo, rinse your hair and body thoroughly to remove the                      Shampoo.  4.  Use CHG as you would any other liquid soap.  You can apply chg directly       to the skin and wash gently with scrungie or a clean washcloth.  5.  Apply the CHG Soap to your body ONLY FROM THE NECK DOWN.        Do not use on open wounds or open sores.  Avoid contact with your eyes,       ears, mouth and genitals (private parts).  Wash genitals (private parts)       with your normal  soap.  6.  Wash thoroughly, paying special attention to the area where your surgery        will be performed.  7.  Thoroughly rinse your body with warm water from the neck down.  8.  DO NOT shower/wash with your normal soap after using and rinsing off       the CHG Soap.  9.  Pat yourself dry with a clean towel.            10.  Wear clean pajamas.            11.  Place clean sheets on your bed the night of your first shower and do not        sleep with pets.  Day of Surgery  Do not apply any lotions/deoderants the morning of surgery.  Please wear clean clothes to the hospital/surgery center.     Please read over the following fact sheets that you were given: Pain Booklet, Coughing and Deep Breathing, Blood Transfusion Information, MRSA Information and Surgical Site Infection Prevention

## 2014-10-12 ENCOUNTER — Encounter (HOSPITAL_COMMUNITY)
Admission: RE | Admit: 2014-10-12 | Discharge: 2014-10-12 | Disposition: A | Discharge status: Home / Self Care | Payer: 59 | Source: Ambulatory Visit | Attending: Neurosurgery | Admitting: Neurosurgery

## 2014-10-12 ENCOUNTER — Encounter (HOSPITAL_COMMUNITY): Payer: Self-pay

## 2014-10-12 DIAGNOSIS — Z01810 Encounter for preprocedural cardiovascular examination: Secondary | ICD-10-CM | POA: Insufficient documentation

## 2014-10-12 DIAGNOSIS — E119 Type 2 diabetes mellitus without complications: Secondary | ICD-10-CM | POA: Diagnosis not present

## 2014-10-12 DIAGNOSIS — I10 Essential (primary) hypertension: Secondary | ICD-10-CM | POA: Diagnosis not present

## 2014-10-12 DIAGNOSIS — Z01812 Encounter for preprocedural laboratory examination: Secondary | ICD-10-CM | POA: Insufficient documentation

## 2014-10-12 HISTORY — DX: Polyneuropathy, unspecified: G62.9

## 2014-10-12 HISTORY — DX: Frequency of micturition: R35.0

## 2014-10-12 LAB — TYPE AND SCREEN
ABO/RH(D): O POS
Antibody Screen: NEGATIVE

## 2014-10-12 LAB — CBC
HCT: 39.9 % (ref 36.0–46.0)
Hemoglobin: 12.8 g/dL (ref 12.0–15.0)
MCH: 28.6 pg (ref 26.0–34.0)
MCHC: 32.1 g/dL (ref 30.0–36.0)
MCV: 89.3 fL (ref 78.0–100.0)
PLATELETS: 277 10*3/uL (ref 150–400)
RBC: 4.47 MIL/uL (ref 3.87–5.11)
RDW: 12.7 % (ref 11.5–15.5)
WBC: 7.1 10*3/uL (ref 4.0–10.5)

## 2014-10-12 LAB — BASIC METABOLIC PANEL
Anion gap: 12 (ref 5–15)
BUN: 17 mg/dL (ref 6–20)
CO2: 26 mmol/L (ref 22–32)
Calcium: 9.3 mg/dL (ref 8.9–10.3)
Chloride: 101 mmol/L (ref 101–111)
Creatinine, Ser: 0.74 mg/dL (ref 0.44–1.00)
Glucose, Bld: 66 mg/dL — ABNORMAL LOW (ref 70–99)
POTASSIUM: 3.8 mmol/L (ref 3.5–5.1)
SODIUM: 139 mmol/L (ref 135–145)

## 2014-10-12 LAB — SURGICAL PCR SCREEN
MRSA, PCR: NEGATIVE
Staphylococcus aureus: NEGATIVE

## 2014-10-13 NOTE — Progress Notes (Signed)
error 

## 2014-10-14 ENCOUNTER — Encounter (HOSPITAL_COMMUNITY): Payer: Self-pay

## 2014-10-14 NOTE — Progress Notes (Signed)
Anesthesia Chart Review:  Patient is a 64 year old female scheduled for L2-3 PLIF on 10/20/14 by Dr. Christella Noa.  History includes non-smoker, HLD, HTN, CHF (normal EF 2010), DM (no meds), GERD, Hiatal hernia s/p reapir and Nissen fundoplication 12/6806, chronic cough, arthritis, colon/rectal cancer s/p surgery '90's, dysphagia, cholecystectomy, hysterectomy, back surgery. BMI is consistent with obesity. PCP is listed as Dr. Redmond School.  Meds include albuterol, Flexeril, Prozac, Neurontin, Lyrica, Altace.  (Cardiac records from 2010 are scanned under Media tab, Correspondence, Encounter 03/02/11.) She was seen by cardiologist Dr. Tery Sanfilippo (formerly with [facility] which is now [facility]) back in 2010 for evaluation of atypical chest pain. Her studies were fairly unremarkable (see below) and no further cardiology follow-up was recommended at that time.  12/06/08 Echo: Mild LVH, LV systolic function is normal EF => 55%, the transmitral spectral Doppler flow pattern is normal for age., mild mitral annular calcification, trace MR, AV appears mildly sclerotic, mild TR, normal RVSP.   11/30/08 Nuclear stress test: The post stress LV is normal in size. The observed defect is consistent with breast attenuation artifact. Post stress LVF is normal, EF 76%. Unremarkable pharmacological stress test. This is a low risk scan.  10/12/14 EKG: NSR, possible LAE, LAD, LVH, cannot rule out septal infarct (age undetermined). Overall, I think her EKG is stable when compared to her 02/23/11 EKG.  Preoperative labs noted. Glucose 66. She will get a CBG on arrival. T&S done.   I think her EKG appears stable.  Cardiac testing okay in 2010.  If no acute changes or new CV symptoms then I would anticipate that she could proceed as planned.  [facility] Stay [facility]/Anesthesiology Phone [phone] 10/14/2014 1:25 PM

## 2014-10-19 MED ORDER — VANCOMYCIN HCL IN DEXTROSE 1-5 GM/200ML-% IV SOLN
1000.0000 mg | INTRAVENOUS | Status: AC
  Administered 2014-10-20: 1000 mg via INTRAVENOUS
  Filled 2014-10-19: qty 200

## 2014-10-20 ENCOUNTER — Encounter (HOSPITAL_COMMUNITY): Payer: Self-pay | Admitting: *Deleted

## 2014-10-20 ENCOUNTER — Inpatient Hospital Stay (HOSPITAL_COMMUNITY): Payer: 59 | Admitting: Vascular Surgery

## 2014-10-20 ENCOUNTER — Inpatient Hospital Stay (HOSPITAL_COMMUNITY)
Admission: RE | Admit: 2014-10-20 | Discharge: 2014-10-23 | DRG: 460 | Disposition: A | Discharge status: Home Health | Payer: 59 | Source: Ambulatory Visit | Attending: Neurosurgery | Admitting: Neurosurgery

## 2014-10-20 ENCOUNTER — Encounter (HOSPITAL_COMMUNITY)
Admission: RE | Disposition: A | Discharge status: Home Health | Payer: Self-pay | Source: Ambulatory Visit | Attending: Neurosurgery

## 2014-10-20 ENCOUNTER — Inpatient Hospital Stay (HOSPITAL_COMMUNITY): Payer: 59

## 2014-10-20 ENCOUNTER — Inpatient Hospital Stay (HOSPITAL_COMMUNITY): Payer: 59 | Admitting: Anesthesiology

## 2014-10-20 DIAGNOSIS — M542 Cervicalgia: Secondary | ICD-10-CM | POA: Diagnosis present

## 2014-10-20 DIAGNOSIS — I10 Essential (primary) hypertension: Secondary | ICD-10-CM | POA: Diagnosis present

## 2014-10-20 DIAGNOSIS — I959 Hypotension, unspecified: Secondary | ICD-10-CM | POA: Diagnosis not present

## 2014-10-20 DIAGNOSIS — M4316 Spondylolisthesis, lumbar region: Secondary | ICD-10-CM | POA: Diagnosis present

## 2014-10-20 DIAGNOSIS — M47816 Spondylosis without myelopathy or radiculopathy, lumbar region: Secondary | ICD-10-CM

## 2014-10-20 DIAGNOSIS — M48062 Spinal stenosis, lumbar region with neurogenic claudication: Secondary | ICD-10-CM | POA: Diagnosis present

## 2014-10-20 DIAGNOSIS — Z882 Allergy status to sulfonamides status: Secondary | ICD-10-CM

## 2014-10-20 DIAGNOSIS — M4806 Spinal stenosis, lumbar region: Principal | ICD-10-CM | POA: Diagnosis present

## 2014-10-20 DIAGNOSIS — M4326 Fusion of spine, lumbar region: Secondary | ICD-10-CM | POA: Diagnosis not present

## 2014-10-20 DIAGNOSIS — M549 Dorsalgia, unspecified: Secondary | ICD-10-CM | POA: Diagnosis not present

## 2014-10-20 DIAGNOSIS — Z981 Arthrodesis status: Secondary | ICD-10-CM | POA: Diagnosis not present

## 2014-10-20 DIAGNOSIS — E119 Type 2 diabetes mellitus without complications: Secondary | ICD-10-CM | POA: Diagnosis present

## 2014-10-20 LAB — POCT I-STAT 4, (NA,K, GLUC, HGB,HCT)
Glucose, Bld: 132 mg/dL — ABNORMAL HIGH (ref 70–99)
HCT: 34 % — ABNORMAL LOW (ref 36.0–46.0)
Hemoglobin: 11.6 g/dL — ABNORMAL LOW (ref 12.0–15.0)
Potassium: 4 mmol/L (ref 3.5–5.1)
SODIUM: 136 mmol/L (ref 135–145)

## 2014-10-20 LAB — GLUCOSE, CAPILLARY
Glucose-Capillary: 87 mg/dL (ref 70–99)
Glucose-Capillary: 153 mg/dL — ABNORMAL HIGH (ref 70–99)

## 2014-10-20 SURGERY — POSTERIOR LUMBAR FUSION 1 LEVEL
Anesthesia: General | Site: Spine Lumbar

## 2014-10-20 MED ORDER — PREGABALIN 75 MG PO CAPS
75.0000 mg | ORAL_CAPSULE | Freq: Two times a day (BID) | ORAL | Status: DC
  Administered 2014-10-20 – 2014-10-23 (×6): 75 mg via ORAL
  Filled 2014-10-20 (×6): qty 1

## 2014-10-20 MED ORDER — SODIUM CHLORIDE 0.9 % IJ SOLN
3.0000 mL | INTRAMUSCULAR | Status: DC | PRN
  Filled 2014-10-20: qty 3

## 2014-10-20 MED ORDER — HYDROMORPHONE HCL 1 MG/ML IJ SOLN
INTRAMUSCULAR | Status: AC
  Filled 2014-10-20: qty 1

## 2014-10-20 MED ORDER — VANCOMYCIN HCL IN DEXTROSE 1-5 GM/200ML-% IV SOLN
1000.0000 mg | Freq: Once | INTRAVENOUS | Status: AC
  Administered 2014-10-20: 1000 mg via INTRAVENOUS
  Filled 2014-10-20: qty 200

## 2014-10-20 MED ORDER — BISACODYL 5 MG PO TBEC: 5.0000 mg | DELAYED_RELEASE_TABLET | Freq: Every day | ORAL | Status: DC | PRN

## 2014-10-20 MED ORDER — PHENOL 1.4 % MT LIQD: 1.0000 | OROMUCOSAL | Status: DC | PRN

## 2014-10-20 MED ORDER — SODIUM CHLORIDE 0.9 % IJ SOLN
3.0000 mL | Freq: Two times a day (BID) | INTRAMUSCULAR | Status: DC
  Administered 2014-10-20 – 2014-10-22 (×5): 3 mL via INTRAVENOUS

## 2014-10-20 MED ORDER — OXYCODONE-ACETAMINOPHEN 5-325 MG PO TABS
1.0000 | ORAL_TABLET | ORAL | Status: DC | PRN
  Administered 2014-10-22: 2 via ORAL
  Administered 2014-10-22 – 2014-10-23 (×2): 1 via ORAL
  Filled 2014-10-20 (×2): qty 1
  Filled 2014-10-20: qty 2

## 2014-10-20 MED ORDER — FENTANYL CITRATE (PF) 250 MCG/5ML IJ SOLN
INTRAMUSCULAR | Status: AC
  Filled 2014-10-20: qty 5

## 2014-10-20 MED ORDER — ONDANSETRON HCL 4 MG/2ML IJ SOLN: 4.0000 mg | INTRAMUSCULAR | Status: DC | PRN

## 2014-10-20 MED ORDER — RAMIPRIL 5 MG PO CAPS
20.0000 mg | ORAL_CAPSULE | Freq: Every day | ORAL | Status: DC
  Administered 2014-10-21 – 2014-10-23 (×3): 20 mg via ORAL
  Filled 2014-10-20 (×3): qty 4

## 2014-10-20 MED ORDER — DIAZEPAM 5 MG PO TABS
5.0000 mg | ORAL_TABLET | Freq: Four times a day (QID) | ORAL | Status: DC | PRN
  Administered 2014-10-21: 5 mg via ORAL
  Filled 2014-10-20: qty 1

## 2014-10-20 MED ORDER — HYDROMORPHONE HCL 1 MG/ML IJ SOLN
0.2500 mg | INTRAMUSCULAR | Status: DC | PRN
Start: 2014-10-20 — End: 2014-10-20
  Administered 2014-10-20: 0.5 mg via INTRAVENOUS

## 2014-10-20 MED ORDER — FENTANYL CITRATE (PF) 100 MCG/2ML IJ SOLN
INTRAMUSCULAR | Status: DC | PRN
  Administered 2014-10-20: 150 ug via INTRAVENOUS
  Administered 2014-10-20: 100 ug via INTRAVENOUS
  Administered 2014-10-20 (×5): 50 ug via INTRAVENOUS

## 2014-10-20 MED ORDER — THROMBIN 20000 UNITS EX SOLR
CUTANEOUS | Status: DC | PRN
  Administered 2014-10-20: 12:00:00 via TOPICAL

## 2014-10-20 MED ORDER — RAMIPRIL 10 MG PO TABS: 20.0000 mg | ORAL_TABLET | Freq: Every day | ORAL | Status: DC

## 2014-10-20 MED ORDER — ALBUTEROL SULFATE HFA 108 (90 BASE) MCG/ACT IN AERS: 1.0000 | INHALATION_SPRAY | Freq: Four times a day (QID) | RESPIRATORY_TRACT | Status: DC | PRN

## 2014-10-20 MED ORDER — MENTHOL 3 MG MT LOZG: 1.0000 | LOZENGE | OROMUCOSAL | Status: DC | PRN

## 2014-10-20 MED ORDER — ALBUTEROL SULFATE (2.5 MG/3ML) 0.083% IN NEBU: 2.5000 mg | INHALATION_SOLUTION | Freq: Four times a day (QID) | RESPIRATORY_TRACT | Status: DC | PRN

## 2014-10-20 MED ORDER — ALBUMIN HUMAN 5 % IV SOLN
INTRAVENOUS | Status: DC | PRN
  Administered 2014-10-20: 14:00:00 via INTRAVENOUS

## 2014-10-20 MED ORDER — LIDOCAINE-EPINEPHRINE 0.5 %-1:200000 IJ SOLN
INTRAMUSCULAR | Status: DC | PRN
  Administered 2014-10-20: 10 mL

## 2014-10-20 MED ORDER — HYDROMORPHONE HCL 1 MG/ML IJ SOLN
0.5000 mg | INTRAMUSCULAR | Status: DC | PRN
Start: 2014-10-20 — End: 2014-10-23

## 2014-10-20 MED ORDER — PROPOFOL 10 MG/ML IV BOLUS
INTRAVENOUS | Status: DC | PRN
  Administered 2014-10-20: 170 mg via INTRAVENOUS
  Administered 2014-10-20: 20 mg via INTRAVENOUS

## 2014-10-20 MED ORDER — PROPOFOL 10 MG/ML IV BOLUS
INTRAVENOUS | Status: AC
  Filled 2014-10-20: qty 20

## 2014-10-20 MED ORDER — ZOLPIDEM TARTRATE 5 MG PO TABS: 5.0000 mg | ORAL_TABLET | Freq: Every evening | ORAL | Status: DC | PRN

## 2014-10-20 MED ORDER — ONDANSETRON HCL 4 MG/2ML IJ SOLN
4.0000 mg | Freq: Once | INTRAMUSCULAR | Status: AC | PRN
  Administered 2014-10-20: 4 mg via INTRAVENOUS

## 2014-10-20 MED ORDER — ACETAMINOPHEN 650 MG RE SUPP: 650.0000 mg | RECTAL | Status: DC | PRN

## 2014-10-20 MED ORDER — FLUOXETINE HCL 10 MG PO CAPS
10.0000 mg | ORAL_CAPSULE | Freq: Every day | ORAL | Status: DC
  Administered 2014-10-21 – 2014-10-23 (×3): 10 mg via ORAL
  Filled 2014-10-20 (×4): qty 1

## 2014-10-20 MED ORDER — HYDROCODONE-ACETAMINOPHEN 5-325 MG PO TABS
ORAL_TABLET | ORAL | Status: AC
  Filled 2014-10-20: qty 1

## 2014-10-20 MED ORDER — DOCUSATE SODIUM 100 MG PO CAPS
100.0000 mg | ORAL_CAPSULE | Freq: Two times a day (BID) | ORAL | Status: DC
  Administered 2014-10-20 – 2014-10-23 (×6): 100 mg via ORAL
  Filled 2014-10-20 (×6): qty 1

## 2014-10-20 MED ORDER — LACTATED RINGERS IV SOLN
INTRAVENOUS | Status: DC | PRN
  Administered 2014-10-20: 11:00:00 via INTRAVENOUS

## 2014-10-20 MED ORDER — LIDOCAINE HCL (CARDIAC) 20 MG/ML IV SOLN
INTRAVENOUS | Status: DC | PRN
  Administered 2014-10-20: 50 mg via INTRAVENOUS

## 2014-10-20 MED ORDER — SENNOSIDES-DOCUSATE SODIUM 8.6-50 MG PO TABS: 1.0000 | ORAL_TABLET | Freq: Every evening | ORAL | Status: DC | PRN

## 2014-10-20 MED ORDER — VANCOMYCIN HCL IN DEXTROSE 1-5 GM/200ML-% IV SOLN
1000.0000 mg | INTRAVENOUS | Status: DC
  Filled 2014-10-20: qty 200

## 2014-10-20 MED ORDER — MIDAZOLAM HCL 5 MG/5ML IJ SOLN
INTRAMUSCULAR | Status: DC | PRN
  Administered 2014-10-20: 2 mg via INTRAVENOUS

## 2014-10-20 MED ORDER — MAGNESIUM CITRATE PO SOLN
1.0000 | Freq: Once | ORAL | Status: AC | PRN
  Filled 2014-10-20: qty 296

## 2014-10-20 MED ORDER — 0.9 % SODIUM CHLORIDE (POUR BTL) OPTIME
TOPICAL | Status: DC | PRN
  Administered 2014-10-20 (×2): 1000 mL

## 2014-10-20 MED ORDER — ONDANSETRON HCL 4 MG/2ML IJ SOLN
INTRAMUSCULAR | Status: AC
  Filled 2014-10-20: qty 2

## 2014-10-20 MED ORDER — POTASSIUM CHLORIDE IN NACL 20-0.9 MEQ/L-% IV SOLN
INTRAVENOUS | Status: DC
  Administered 2014-10-20: 21:00:00 via INTRAVENOUS
  Filled 2014-10-20: qty 1000

## 2014-10-20 MED ORDER — GABAPENTIN 300 MG PO CAPS
300.0000 mg | ORAL_CAPSULE | Freq: Three times a day (TID) | ORAL | Status: DC
  Administered 2014-10-20 – 2014-10-23 (×8): 300 mg via ORAL
  Filled 2014-10-20 (×8): qty 1

## 2014-10-20 MED ORDER — ROCURONIUM BROMIDE 100 MG/10ML IV SOLN
INTRAVENOUS | Status: DC | PRN
  Administered 2014-10-20: 50 mg via INTRAVENOUS

## 2014-10-20 MED ORDER — HYDROCODONE-ACETAMINOPHEN 5-325 MG PO TABS
1.0000 | ORAL_TABLET | ORAL | Status: DC | PRN
  Administered 2014-10-20: 1 via ORAL
  Administered 2014-10-21: 2 via ORAL
  Filled 2014-10-20 (×2): qty 2

## 2014-10-20 MED ORDER — MIDAZOLAM HCL 2 MG/2ML IJ SOLN
INTRAMUSCULAR | Status: AC
  Filled 2014-10-20: qty 2

## 2014-10-20 MED ORDER — SODIUM CHLORIDE 0.9 % IV SOLN: 250.0000 mL | INTRAVENOUS | Status: DC

## 2014-10-20 MED ORDER — ACETAMINOPHEN 325 MG PO TABS
650.0000 mg | ORAL_TABLET | ORAL | Status: DC | PRN
  Administered 2014-10-21: 650 mg via ORAL
  Filled 2014-10-20: qty 2

## 2014-10-20 MED ORDER — LACTATED RINGERS IV SOLN
INTRAVENOUS | Status: DC
  Administered 2014-10-20: 09:00:00 via INTRAVENOUS

## 2014-10-20 SURGICAL SUPPLY — 67 items
APL SKNCLS STERI-STRIP NONHPOA (GAUZE/BANDAGES/DRESSINGS)
BENZOIN TINCTURE PRP APPL 2/3 (GAUZE/BANDAGES/DRESSINGS) IMPLANT
BLADE CLIPPER SURG (BLADE) IMPLANT
BUR MATCHSTICK NEURO 3.0 LAGG (BURR) ×3 IMPLANT
CANISTER SUCT 3000ML PPV (MISCELLANEOUS) ×3 IMPLANT
CLOSURE WOUND 1/2 X4 (GAUZE/BANDAGES/DRESSINGS)
CONT SPEC 4OZ CLIKSEAL STRL BL (MISCELLANEOUS) ×3 IMPLANT
COVER BACK TABLE 60X90IN (DRAPES) ×3 IMPLANT
DECANTER SPIKE VIAL GLASS SM (MISCELLANEOUS) ×3 IMPLANT
DRAPE C-ARM 42X72 X-RAY (DRAPES) ×6 IMPLANT
DRAPE C-ARMOR (DRAPES) IMPLANT
DRAPE LAPAROTOMY 100X72X124 (DRAPES) ×3 IMPLANT
DRAPE POUCH INSTRU U-SHP 10X18 (DRAPES) ×3 IMPLANT
DRAPE SURG 17X23 STRL (DRAPES) ×3 IMPLANT
DRSG OPSITE POSTOP 4X6 (GAUZE/BANDAGES/DRESSINGS) ×2 IMPLANT
DRSG TELFA 3X8 NADH (GAUZE/BANDAGES/DRESSINGS) IMPLANT
DURAPREP 26ML APPLICATOR (WOUND CARE) ×3 IMPLANT
ELECT REM PT RETURN 9FT ADLT (ELECTROSURGICAL) ×3
ELECTRODE REM PT RTRN 9FT ADLT (ELECTROSURGICAL) ×1 IMPLANT
GAUZE SPONGE 4X4 12PLY STRL (GAUZE/BANDAGES/DRESSINGS) IMPLANT
GAUZE SPONGE 4X4 16PLY XRAY LF (GAUZE/BANDAGES/DRESSINGS) ×2 IMPLANT
GLOVE BIO SURGEON STRL SZ7 (GLOVE) ×10 IMPLANT
GLOVE BIO SURGEON STRL SZ8 (GLOVE) ×2 IMPLANT
GLOVE BIOGEL PI IND STRL 7.5 (GLOVE) IMPLANT
GLOVE BIOGEL PI INDICATOR 7.5 (GLOVE) ×4
GLOVE ECLIPSE 6.5 STRL STRAW (GLOVE) ×6 IMPLANT
GLOVE EXAM NITRILE LRG STRL (GLOVE) IMPLANT
GLOVE EXAM NITRILE MD LF STRL (GLOVE) IMPLANT
GLOVE EXAM NITRILE XL STR (GLOVE) IMPLANT
GLOVE EXAM NITRILE XS STR PU (GLOVE) IMPLANT
GLOVE INDICATOR 8.5 STRL (GLOVE) ×2 IMPLANT
GOWN STRL REUS W/ TWL LRG LVL3 (GOWN DISPOSABLE) ×2 IMPLANT
GOWN STRL REUS W/ TWL XL LVL3 (GOWN DISPOSABLE) IMPLANT
GOWN STRL REUS W/TWL 2XL LVL3 (GOWN DISPOSABLE) IMPLANT
GOWN STRL REUS W/TWL LRG LVL3 (GOWN DISPOSABLE) ×9
GOWN STRL REUS W/TWL XL LVL3 (GOWN DISPOSABLE) ×3
KIT BASIN OR (CUSTOM PROCEDURE TRAY) ×3 IMPLANT
KIT POSITION SURG JACKSON T1 (MISCELLANEOUS) ×3 IMPLANT
KIT ROOM TURNOVER OR (KITS) ×3 IMPLANT
LIQUID BAND (GAUZE/BANDAGES/DRESSINGS) ×3 IMPLANT
NDL HYPO 25X1 1.5 SAFETY (NEEDLE) ×1 IMPLANT
NDL SPNL 18GX3.5 QUINCKE PK (NEEDLE) IMPLANT
NEEDLE HYPO 25X1 1.5 SAFETY (NEEDLE) ×3 IMPLANT
NEEDLE SPNL 18GX3.5 QUINCKE PK (NEEDLE) IMPLANT
NS IRRIG 1000ML POUR BTL (IV SOLUTION) ×3 IMPLANT
PACK LAMINECTOMY NEURO (CUSTOM PROCEDURE TRAY) ×3 IMPLANT
PAD ARMBOARD 7.5X6 YLW CONV (MISCELLANEOUS) ×6 IMPLANT
PAD DRESSING TELFA 3X8 NADH (GAUZE/BANDAGES/DRESSINGS) IMPLANT
RASP 3.0MM (RASP) ×2 IMPLANT
ROD DANEK 40MM (Rod) ×4 IMPLANT
SCREW 4.0X45MM (Screw) ×4 IMPLANT
SCREW SET BREAK OFF (Screw) ×8 IMPLANT
SPACER OPAL REVOLVE 10MM ×2 IMPLANT
SPONGE LAP 4X18 X RAY DECT (DISPOSABLE) IMPLANT
SPONGE SURGIFOAM ABS GEL 100 (HEMOSTASIS) ×3 IMPLANT
STRIP CLOSURE SKIN 1/2X4 (GAUZE/BANDAGES/DRESSINGS) IMPLANT
SUT ETHILON 3 0 FSL (SUTURE) ×2 IMPLANT
SUT PROLENE 6 0 BV (SUTURE) IMPLANT
SUT VIC AB 0 CT1 18XCR BRD8 (SUTURE) ×1 IMPLANT
SUT VIC AB 0 CT1 8-18 (SUTURE) ×6
SUT VIC AB 2-0 CT1 18 (SUTURE) ×5 IMPLANT
SUT VIC AB 3-0 SH 8-18 (SUTURE) ×5 IMPLANT
SYR 20ML ECCENTRIC (SYRINGE) ×3 IMPLANT
TOWEL OR 17X24 6PK STRL BLUE (TOWEL DISPOSABLE) ×3 IMPLANT
TOWEL OR 17X26 10 PK STRL BLUE (TOWEL DISPOSABLE) ×3 IMPLANT
TRAY FOLEY CATH 14FRSI W/METER (CATHETERS) ×3 IMPLANT
WATER STERILE IRR 1000ML POUR (IV SOLUTION) ×3 IMPLANT

## 2014-10-20 NOTE — Anesthesia Preprocedure Evaluation (Addendum)
Anesthesia Evaluation  Patient identified by MRN, date of birth, ID band Patient awake    Reviewed: Allergy & Precautions, NPO status , Patient's Chart, lab work & pertinent test results  Airway Mallampati: I       Dental   Pulmonary    Pulmonary exam normal       Cardiovascular hypertension, +CHF Normal cardiovascular exam    Neuro/Psych  Headaches,  Neuromuscular disease    GI/Hepatic GERD-  ,  Endo/Other  diabetes, Type 2  Renal/GU      Musculoskeletal  (+) Arthritis -,   Abdominal   Peds  Hematology   Anesthesia Other Findings   Reproductive/Obstetrics                           Anesthesia Physical Anesthesia Plan  ASA: III  Anesthesia Plan: General   Post-op Pain Management:    Induction: Intravenous  Airway Management Planned: Oral ETT  Additional Equipment:   Intra-op Plan:   Post-operative Plan: Extubation in OR  Informed Consent: I have reviewed the patients History and Physical, chart, labs and discussed the procedure including the risks, benefits and alternatives for the proposed anesthesia with the patient or authorized representative who has indicated his/her understanding and acceptance.     Plan Discussed with: CRNA, Anesthesiologist and Surgeon  Anesthesia Plan Comments:         Anesthesia Quick Evaluation

## 2014-10-20 NOTE — Transfer of Care (Signed)
Immediate Anesthesia Transfer of Care Note  Patient: Lori Martinez  Procedure(s) Performed: Procedure(s) with comments: LUMBAR TWO-THREE POSTERIOR LUMBAR FUSION 1 LEVEL (N/A) - L23 posterior lumbar interbody fusion with interbody prosthesis posterior lateral arthrodesis and posterior segmental instrumentation; extention of fusion from L34  Patient Location: PACU  Anesthesia Type:General  Level of Consciousness: awake, alert  and oriented  Airway & Oxygen Therapy: Patient Spontanous Breathing and Patient connected to nasal cannula oxygen  Post-op Assessment: Report given to RN and Post -op Vital signs reviewed and stable  Post vital signs: Reviewed and stable  Last Vitals:  Filed Vitals:   10/20/14 0805  BP:   Pulse:   Temp: 36.7 C  Resp:     Complications: No apparent anesthesia complications

## 2014-10-20 NOTE — Progress Notes (Signed)
ANTIBIOTIC CONSULT NOTE - INITIAL  Pharmacy Consult for Vancomycin Indication: post-op prophylzxis  Allergies  Allergen Reactions  . Sulfonamide Derivatives Hives and Other (See Comments)    Causes dry mouth. Hives will spread all over the body depending on how much has been given to patient.  . Penicillins Rash and Other (See Comments)    Causes dry mouth. The rash can spread all over the body depending on how much has been given to patient.    Patient Measurements: Height: 5\' 4"  (162.6 cm) Weight: 204 lb 2.3 oz (92.6 kg) IBW/kg (Calculated) : 54.7  Vital Signs: Temp: 98.4 F (36.9 C) (05/11 1955) Temp Source: Oral (05/11 1955) BP: 140/59 mmHg (05/11 1955) Pulse Rate: 109 (05/11 1955) Intake/Output from previous day:   Intake/Output from this shift:    Labs: No results for input(s): WBC, HGB, PLT, LABCREA, CREATININE in the last 72 hours. Estimated Creatinine Clearance: 77.4 mL/min (by C-G formula based on Cr of 0.74). No results for input(s): VANCOTROUGH, VANCOPEAK, VANCORANDOM, GENTTROUGH, GENTPEAK, GENTRANDOM, TOBRATROUGH, TOBRAPEAK, TOBRARND, AMIKACINPEAK, AMIKACINTROU, AMIKACIN in the last 72 hours.   Microbiology: Recent Results (from the past 720 hour(s))  Surgical pcr screen     Status: None   Collection Time: 10/12/14  9:52 AM  Result Value Ref Range Status   MRSA, PCR NEGATIVE NEGATIVE Final   Staphylococcus aureus NEGATIVE NEGATIVE Final    Comment:        The Xpert SA Assay (FDA approved for NASAL specimens in patients over 63 years of age), is one component of a comprehensive surveillance program.  Test performance has been validated by [facility] for patients greater than or equal to 50 year old. It is not intended to diagnose infection nor to guide or monitor treatment.     Medical History: Past Medical History  Diagnosis Date  . Cancer   . Hyperlipidemia   . Hypertension   . Acid reflux   . Cataract   . Diabetes mellitus   . CHF  (congestive heart failure)   . Arthritis     hands and knees  . Trouble swallowing   . Hearing loss   . Change in voice   . Chronic cough   . Chest pain   . Vomiting     as result of acid reflux   . Headaches, cluster   . Neuropathy, peripheral   . Frequent urination     Medications:  Prescriptions prior to admission  Medication Sig Dispense Refill Last Dose  . albuterol (PROVENTIL HFA;VENTOLIN HFA) 108 (90 BASE) MCG/ACT inhaler Inhale 1-2 puffs into the lungs every 6 (six) hours as needed for wheezing or shortness of breath.     . CRANBERRY PO Take 1 tablet by mouth daily.   Past Week at Unknown time  . cyclobenzaprine (FLEXERIL) 10 MG tablet Take 10 mg by mouth 3 (three) times daily as needed for muscle spasms.   10/19/2014 at Unknown time  . FLUoxetine (PROZAC) 10 MG capsule Take 10 mg by mouth daily.   10/19/2014 at Unknown time  . gabapentin (NEURONTIN) 300 MG capsule Take 300 mg by mouth 3 (three) times daily.     10/19/2014 at Unknown time  . Multiple Vitamins-Minerals (CENTRUM SILVER PO) Take by mouth daily.     Past Week at Unknown time  . pregabalin (LYRICA) 75 MG capsule Take 75 mg by mouth 2 (two) times daily.   10/19/2014 at Unknown time  . ramipril (ALTACE) 10 MG tablet Take 20 mg  by mouth daily. Pt reports taking 20 mg qd   10/19/2014 at Unknown time  . predniSONE (DELTASONE) 20 MG tablet Take 2 tablets (40 mg total) by mouth daily. (Patient not taking: Reported on 10/12/2014) 10 tablet 0 Completed Course at Unknown time   Assessment: 64 yo F s/p back surgery today.  Pt received Vancomycin 1gm pre-op at 1045 this morning.  To continue post-op x 1 dose (pt does not have drain in place).  Goal of Therapy:  Vancomycin trough level 10-15 mcg/ml  Plan:  Vancomycin 1gm IV x 1 dose at 2200 tonight. Rx will sign off.  No further doses (no drain in place).  [employer], Pharm.D., BCPS Clinical Pharmacist Pager [phone] 10/20/2014 8:12 PM

## 2014-10-20 NOTE — Op Note (Signed)
10/20/2014  9:58 PM  PATIENT:  Lori Martinez  64 y.o. female  PRE-OPERATIVE DIAGNOSIS:  lumbar spondylolisthesis L2/3, Lumbar stenosis L2/3  POST-OPERATIVE DIAGNOSIS:  lumbar spondylolisthesis L2/3, Lumbar stenosis L2/3  PROCEDURE:  Procedure(s): LUMBAR TWO-THREE POSTERIOR LUMBAR Interbody FUSION , peek opal cages 82mm, morselized autograft Non segmental pedicle screw fixation L2/3, Legacy hardware, screws placed at L2, existing hardware at L3 Lumbar decompression beyond the exposure needed for an interbody arthrodesis, L2, and L3 root decompression  SURGEON:  Surgeon(s): Ashok Pall, MD Kary Kos, MD  ASSISTANTS:Cram, Dominica Severin  ANESTHESIA:   general  EBL:     BLOOD ADMINISTERED:none  CELL SAVER GIVEN:none  COUNT:per nursing  DRAINS: none   SPECIMEN:  No Specimen  DICTATION: Lori Martinez is a 64 y.o. female whom was taken to the operating room intubated, and placed under a general anesthetic without difficulty. A foley catheter was placed under sterile conditions. She was positioned prone on a Jackson stable with all pressure points properly padded.  Her lumbar region was prepped and draped in a sterile manner. I infiltrated 10cc's 1/2%lidocaine/1:2000,000 strength epinephrine into the rostral portion of her previous incision. I opened the skin with a 10 blade and took the incision down to the thoracolumbar fascia. I exposed the lamina of L1,2, and the L3 pedicle screws  in a subperiosteal fashion bilaterally.   I placed self retaining retractors and started the decompression. I performed a complete laminectomy of L2, and inferior facetectomies of L2 bilaterally to decompress the spinal cana, and the L2, and L3 roots. The ligamentum flavum was hypertrophied and causing the most compression of the thecal sac and nerves.  PLIF's were performed at L2/3 in the same fashion. I opened the disc space with a 15 blade then used a variety of instruments to remove the disc and prepare the space  for the arthrodesis. I used curettes, rongeurs, punches, shavers for the disc space, and rasps in the discetomy. I measured the disc space and placed 52mm  Peek cages(Opal,synthes) into the disc space(s).   We placed pedicle screws at L2, using fluoroscopic guidance. I drilled a pilot hole, then cannulated the pedicle with a probe at each site. We then tapped each pedicle, assessing each site for pedicle violations. No cutouts were appreciated at the final screw placement. Screws (Medtronic legacy 4.33mm) were then placed at each site without difficulty. Final films were performed and all screws appeared to be in good position. I tried to use connectors to connect the new screws to the existing hardware but could not do so. I then cut the rods between the L3 and L4 screws. I removed the locking caps of the L3 screws, then the rod pieces. I placed a new rod between the L2, and L3 screws and secured them with locking caps on each side. Final xray of the construct looked good.  I closed the wound in a layered fashion. I approximated the thoracolumbar fascia, subcutaneous, and subcuticular planes with vicryl sutures. I approximated the skin edges with a nylon running suture. I used a honeycomb bandage for a sterile dressing.     PLAN OF CARE: Admit to inpatient   PATIENT DISPOSITION:  PACU - hemodynamically stable.   Delay start of Pharmacological VTE agent (>24hrs) due to surgical blood loss or risk of bleeding:  yes

## 2014-10-20 NOTE — Anesthesia Procedure Notes (Signed)
Procedure Name: Intubation Date/Time: 10/20/2014 10:50 AM Performed by: Eligha Bridegroom Pre-anesthesia Checklist: Patient identified, Emergency Drugs available, Suction available, Patient being monitored and Timeout performed Patient Re-evaluated:Patient Re-evaluated prior to inductionOxygen Delivery Method: Circle system utilized Preoxygenation: Pre-oxygenation with 100% oxygen Intubation Type: IV induction Ventilation: Mask ventilation without difficulty and Oral airway inserted - appropriate to patient size Laryngoscope Size: Mac and 3 Grade View: Grade I Tube size: 7.0 mm Number of attempts: 1 Airway Equipment and Method: Stylet and LTA kit utilized Placement Confirmation: ETT inserted through vocal cords under direct vision and positive ETCO2 Secured at: 21 cm Tube secured with: Tape Dental Injury: Teeth and Oropharynx as per pre-operative assessment

## 2014-10-20 NOTE — Anesthesia Postprocedure Evaluation (Signed)
  Anesthesia Post-op Note  Patient: Lori Martinez  Procedure(s) Performed: Procedure(s) with comments: LUMBAR TWO-THREE POSTERIOR LUMBAR FUSION 1 LEVEL (N/A) - L23 posterior lumbar interbody fusion with interbody prosthesis posterior lateral arthrodesis and posterior segmental instrumentation; extention of fusion from L34  Patient Location: PACU  Anesthesia Type: General   Level of Consciousness: awake, alert  and oriented  Airway and Oxygen Therapy: Patient Spontanous Breathing  Post-op Pain: mild  Post-op Assessment: Post-op Vital signs reviewed  Post-op Vital Signs: Reviewed  Last Vitals:  Filed Vitals:   10/20/14 1815  BP:   Pulse: 103  Temp:   Resp: 12    Complications: No apparent anesthesia complications

## 2014-10-20 NOTE — H&P (Signed)
BP 115/58 mmHg  Pulse 96  Temp(Src) 98.1 F (36.7 C)  Resp 18  Ht 5\' 4"  (1.626 m)  Wt 90.719 kg (200 lb)  BMI 34.31 kg/m2  SpO2 100%    Lori Martinez comes in today complaining of pain in both lower extremities over the last three months.  She said the pain came on fairly slowly but has progressed and is worse now than it was when the pain started.  Nothing will relieve the pain other than sitting or lying down.  It is worse when she stands or has to walk.  When she first arises in the morning, she says she can deal with what she is feeling but it gets worse over the course of the day.  She is right-handed, retired.         On a pain chart, she lists pain in the neck and both upper extremities along with both lower extremities.  She is complaining of a great deal of pain in what she says is her neck whenever she raises both arms above her head.  She says that this has also been going on for some time, but she does not remember any injury to the shoulders or neck.     REVIEW OF SYSTEMS:                                    Review of systems is positive for pain in the back, arm, and leg, neck pain, arm weakness and leg weakness.  She denies constitutional, eye, ear, nose, throat, mouth, cardiovascular, respiratory, gastrointestinal, genitourinary, skin, neurological, psychiatric, endocrine, hematologic and allergic problems.  PAST MEDICAL HISTORY:                                Past medical history is significant for hypertension and diabetes.              Medications and Allergies:  SHE IS ALLERGIC TO SULFA-CONTAINING MEDICATIONS AND PENICILLIN, BOTH OF WHICH CAUSE HIVES.  Medications are cyclobenzaprine, fluoxetine, gabapentin, Lyrica, and ramipril.     FAMILY HISTORY:                                            Mother and father are both deceased.  Cerebrovascular disease and coronary artery disease present in the family history.      SOCIAL HISTORY:                                             She does not smoke.  She does not use alcohol.  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              PHYSICAL EXAMINATION:                          She is 5 feet 2.5 inches in height.  She weighs 189 pounds.  Blood pressure is 107/72, pulse is 67, respiratory rate is 15, temperature is 97.6.  Pain is rated as 8/10.     NEUROLOGICAL EXAMINATION:  On examination, Lori Martinez is alert.  She is oriented x4, answering all questions appropriately.  She has a markedly antalgic gait and does not fully extend or flex the right hip when she walks.  She does walk very slowly, but is steady in her gait.  Pupils equal, round and reactive to light.  Full extraocular movements.  Full visual fields.  Hearing intact to voice.  Symmetric facial movement.  She has 2+ reflexes in biceps, triceps, brachioradialis, knees and ankles.  No clonus.  No Hoffman sign.  Romberg is negative.     Lori Martinez has significant pain with abduction of her shoulder and with passive abduction of the shoulders bilaterally.    Lori Martinez returns today.  She certainly has some spondylytic change present in the neck, which we could see on the CT and is confirmed by the MRI.  I think that, more so than any particular root compression, is causing the pain that she does have.  She has spondylosis, worse at 4-5 and at 5-6.  I would do the procedure.  But, she says today that the walking is just getting worse and worse by the day and that at this point she would just go ahead and do the lumbar spine.  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 We will get this scheduled as soon as we can, but April is looking fairly full.  Risks and benefits she understands, having undergone a procedure before.     She continues to have the pain in the neck on the right side, but we  will attack that after we decompress and extend the hardware in the lower back.

## 2014-10-21 NOTE — Evaluation (Signed)
Occupational Therapy Evaluation Patient Details Name: Lori Martinez MRN: [id number] DOB: Jul 28, 1950 Today's Date: 10/21/2014    History of Present Illness 64 yo female s/p L 2-3 fusion with brace PMH: CA HTN DM   Clinical Impression   Patient is s/p L2-3 fusion surgery resulting in functional limitations due to the deficits listed below (see OT problem list). PTA living at home with spouse MOD I Patient will benefit from skilled OT acutely to increase independence and safety with ADLS to allow discharge hhot with RW. PT will have spouse upon immediate d/c home.      Follow Up Recommendations  Home health OT;Supervision/Assistance - 24 hour    Equipment Recommendations  Other (comment) (RW)    Recommendations for Other Services       Precautions / Restrictions Precautions Precautions: Back Precaution Comments: handout provided Required Braces or Orthoses: Spinal Brace Spinal Brace: Lumbar corset;Applied in sitting position Restrictions Weight Bearing Restrictions: No      Mobility Bed Mobility Overal bed mobility: Needs Assistance Bed Mobility: Supine to Sit     Supine to sit: Mod assist     General bed mobility comments: pt needed education on sequence and (A) to rotate trunk  Transfers Overall transfer level: Needs assistance Equipment used: Rolling walker (2 wheeled) Transfers: Sit to/from Stand Sit to Stand: Min assist         General transfer comment: cues for hand placement    Balance Overall balance assessment: Needs assistance         Standing balance support: Single extremity supported;During functional activity Standing balance-Leahy Scale: Fair                              ADL Overall ADL's : Needs assistance/impaired     Grooming: Oral care;Min guard Grooming Details (indicate cue type and reason): educated on using cups to maintain back precautions             Lower Body Dressing: Maximal assistance;Sit to/from  stand   Toilet Transfer: Minimal assistance;Ambulation;BSC;RW   Toileting- Water quality scientist and Hygiene: Min guard;Sit to/from stand       Functional mobility during ADLs: Minimal assistance;Rolling walker General ADL Comments: Pt educated on back precautions and recalling 2 out 3. Pt needed reeducation on arching. Pts spouse present and very good recall of education. Pt x3 surg on back to date. pt fatigued with mobility and decr posture with mobility due to fatigue/ pain     Vision     Perception     Praxis      Pertinent Vitals/Pain Pain Assessment: 0-10 Pain Score: 7  Pain Location: lower back Pain Descriptors / Indicators: Constant Pain Intervention(s): Premedicated before session;Repositioned;Monitored during session     Hand Dominance Right   Extremity/Trunk Assessment Upper Extremity Assessment Upper Extremity Assessment: Overall WFL for tasks assessed   Lower Extremity Assessment Lower Extremity Assessment: Defer to PT evaluation   Cervical / Trunk Assessment Cervical / Trunk Assessment: Other exceptions (previous back surg)   Communication Communication Communication: No difficulties   Cognition Arousal/Alertness: Awake/alert Behavior During Therapy: WFL for tasks assessed/performed Overall Cognitive Status: Impaired/Different from baseline       Memory: Decreased short-term memory             General Comments       Exercises       Shoulder Instructions      Home Living Family/patient expects to be discharged to::  Private residence Living Arrangements: Spouse/significant other Available Help at Discharge: Family;Friend(s);Available 24 hours/day (son available and husband ) Type of Home: House Home Access: Level entry (gravel)     Home Layout: One level;Other (Comment) (1 threshold)     Bathroom Shower/Tub: Tub/shower unit Shower/tub characteristics: Architectural technologist: Standard     Home Equipment: Environmental consultant - 2 wheels           Prior Functioning/Environment Level of Independence: Independent             OT Diagnosis: Generalized weakness;Acute pain   OT Problem List: Decreased strength;Decreased activity tolerance;Impaired balance (sitting and/or standing);Decreased safety awareness;Decreased knowledge of use of DME or AE;Decreased knowledge of precautions;Pain   OT Treatment/Interventions: Self-care/ADL training;Therapeutic exercise;DME and/or AE instruction;Therapeutic activities;Patient/family education;Balance training    OT Goals(Current goals can be found in the care plan section) Acute Rehab OT Goals Patient Stated Goal: to get out of bed today OT Goal Formulation: With patient/family Time For Goal Achievement: 11/04/14 Potential to Achieve Goals: Good  OT Frequency: Min 2X/week   Barriers to D/C:            Co-evaluation              End of Session Equipment Utilized During Treatment: Gait belt;Rolling walker;Back brace Nurse Communication: Mobility status;Precautions  Activity Tolerance: Patient tolerated treatment well Patient left: Other (comment) (with PT Megan)   Time: 3474-2595 OT Time Calculation (min): 15 min Charges:  OT General Charges $OT Visit: 1 Procedure OT Evaluation $Initial OT Evaluation Tier I: 1 Procedure (dove tail with PT due to brace arrival) G-Codes:    Peri Maris 10-30-2014, 2:42 PM Pager: [phone]

## 2014-10-21 NOTE — Evaluation (Signed)
Physical Therapy Evaluation Patient Details Name: Lori Martinez MRN: [id number] DOB: 1950/12/21 Today's Date: 10/21/2014   History of Present Illness  64 yo female s/p L 2-3 fusion with brace PMH: CA HTN DM  Clinical Impression  Pt very eager for ambulation, but limited by pain.  Pt will have good family support and plans to return home at D/C.  Will continue to follow while ona cute.      Follow Up Recommendations Home health PT;Supervision/Assistance - 24 hour    Equipment Recommendations  Rolling walker with 5" wheels    Recommendations for Other Services       Precautions / Restrictions Precautions Precautions: Back Precaution Booklet Issued: Yes (comment) Precaution Comments: handout provided Required Braces or Orthoses: Spinal Brace Spinal Brace: Lumbar corset;Applied in sitting position Restrictions Weight Bearing Restrictions: No      Mobility  Bed Mobility Overal bed mobility: Needs Assistance Bed Mobility: Supine to Sit     Supine to sit: Mod assist     General bed mobility comments: pt up with OT.    Transfers Overall transfer level: Needs assistance Equipment used: Rolling walker (2 wheeled) Transfers: Sit to/from Stand Sit to Stand: Min assist         General transfer comment: cues for UE use.    Ambulation/Gait Ambulation/Gait assistance: Min guard Ambulation Distance (Feet): 80 Feet Assistive device: Rolling walker (2 wheeled) Gait Pattern/deviations: Step-through pattern;Decreased stride length;Trunk flexed     General Gait Details: cues for upright posture and encouragement.  cues for positioning within RW during turns.    Stairs            Wheelchair Mobility    Modified Rankin (Stroke Patients Only)       Balance Overall balance assessment: Needs assistance Sitting-balance support: No upper extremity supported;Feet supported Sitting balance-Leahy Scale: Good     Standing balance support: During functional  activity Standing balance-Leahy Scale: Fair                               Pertinent Vitals/Pain Pain Assessment: 0-10 Pain Score: 7  Pain Location: Low back Pain Descriptors / Indicators: Constant Pain Intervention(s): Monitored during session;Premedicated before session;Repositioned    Home Living Family/patient expects to be discharged to:: Private residence Living Arrangements: Spouse/significant other Available Help at Discharge: Family;Friend(s);Available 24 hours/day (son available and husband ) Type of Home: House Home Access: Level entry (gravel)     Home Layout: One level;Other (Comment) (1 threshold) Home Equipment: Walker - 2 wheels      Prior Function Level of Independence: Independent               Hand Dominance   Dominant Hand: Right    Extremity/Trunk Assessment   Upper Extremity Assessment: Defer to OT evaluation           Lower Extremity Assessment: Overall WFL for tasks assessed      Cervical / Trunk Assessment: Other exceptions (previous back surg)  Communication   Communication: No difficulties  Cognition Arousal/Alertness: Awake/alert Behavior During Therapy: WFL for tasks assessed/performed Overall Cognitive Status: Within Functional Limits for tasks assessed       Memory: Decreased short-term memory              General Comments      Exercises        Assessment/Plan    PT Assessment Patient needs continued PT services  PT Diagnosis Difficulty walking;Acute  pain   PT Problem List Decreased strength;Decreased activity tolerance;Decreased balance;Decreased mobility;Decreased knowledge of use of DME;Decreased knowledge of precautions;Pain  PT Treatment Interventions DME instruction;Gait training;Functional mobility training;Therapeutic activities;Therapeutic exercise;Balance training;Patient/family education   PT Goals (Current goals can be found in the Care Plan section) Acute Rehab PT Goals Patient  Stated Goal: to get out of bed today PT Goal Formulation: With patient Time For Goal Achievement: 10/28/14 Potential to Achieve Goals: Good    Frequency Min 5X/week   Barriers to discharge        Co-evaluation               End of Session Equipment Utilized During Treatment: Gait belt;Back brace Activity Tolerance: Patient tolerated treatment well;No increased pain Patient left: in chair;with call bell/phone within reach;with family/visitor present Nurse Communication: Mobility status         Time: 2130-8657 PT Time Calculation (min) (ACUTE ONLY): 21 min   Charges:   PT Evaluation $Initial PT Evaluation Tier I: 1 Procedure     PT G CodesCatarina Hartshorn, [place] 10/21/2014, 3:58 PM

## 2014-10-21 NOTE — Progress Notes (Signed)
Subjective: Patient reports Doing very well with improved leg pain back soreness Speck did  Objective: Vital signs in last 24 hours: Temp:  [98.1 F (36.7 C)-99.1 F (37.3 C)] 98.6 F (37 C) (05/12 0606) Pulse Rate:  [96-109] 109 (05/12 0606) Resp:  [6-18] 14 (05/12 0606) BP: (96-140)/(33-69) 96/33 mmHg (05/12 0606) SpO2:  [93 %-100 %] 93 % (05/12 0606) Weight:  [90.719 kg (200 lb)-92.6 kg (204 lb 2.3 oz)] 92.6 kg (204 lb 2.3 oz) (05/11 1955)  Intake/Output from previous day: 05/11 0701 - 05/12 0700 In: 2750 [I.V.:2500; IV Piggyback:250] Out: 1985 [Urine:1525; Blood:460] Intake/Output this shift:    Strength out of 5 wound clean dry and intact  Lab Results:  Recent Labs  10/20/14 1339  HGB 11.6*  HCT 34.0*   BMET  Recent Labs  10/20/14 1339  NA 136  K 4.0  GLUCOSE 132*    Studies/Results: Dg Lumbar Spine 2-3 Views  10/20/2014   CLINICAL DATA:  PLIF to L2-3  EXAM: LUMBAR SPINE - 2-3 VIEW; DG C-ARM 61-120 MIN  COMPARISON:  None.  FLUOROSCOPY TIME:  Time:  53 seconds.  Total images:  1  FINDINGS: Single cross-table lateral view of the lumbar spine is provided. There is a posterior metallic probe with the tip projecting along the posterior margin of the L2-3 disc space. There is remote posterior lumbar fusion from L3 through S1.  Single fluoroscopic frontal view of the lumbar spine demonstrates bilobed pedicle screws at L2.  IMPRESSION: Interval PLIF at L2-3.   Electronically Signed   By: Kathreen Devoid   On: 10/20/2014 15:55   Dg Lumbar Spine 1 View  10/20/2014   CLINICAL DATA:  Postop from spinal fusion.  EXAM: LUMBAR SPINE - 1 VIEW  COMPARISON:  10/20/2014 at 1316 hours  FINDINGS: Previous fusion from L3 through S1 as revised. Prior fusion hardware persists from L4 through S1. There are interconnecting rods and pedicle screws at L2 and L3, with the L2 pedicle screws and interconnecting rods new, fusing of did L3.  The orthopedic hardware is well-seated and aligned.   IMPRESSION: Operative imaging from spinal fusion at L2 and L3 with previous fusion noted from L4 through S1.   Electronically Signed   By: Lajean Manes M.D.   On: 10/20/2014 17:06   Dg C-arm 1-60 Min  10/20/2014   CLINICAL DATA:  PLIF to L2-3  EXAM: LUMBAR SPINE - 2-3 VIEW; DG C-ARM 61-120 MIN  COMPARISON:  None.  FLUOROSCOPY TIME:  Time:  53 seconds.  Total images:  1  FINDINGS: Single cross-table lateral view of the lumbar spine is provided. There is a posterior metallic probe with the tip projecting along the posterior margin of the L2-3 disc space. There is remote posterior lumbar fusion from L3 through S1.  Single fluoroscopic frontal view of the lumbar spine demonstrates bilobed pedicle screws at L2.  IMPRESSION: Interval PLIF at L2-3.   Electronically Signed   By: Kathreen Devoid   On: 10/20/2014 15:55    Assessment/Plan: Mobilization today in a brace slightly hypotensive this morning we'll give her fluid bolus of 500 mL saline. PT OT  LOS: 1 day     Maebelle Sulton P 10/21/2014, 8:00 AM

## 2014-10-21 NOTE — Progress Notes (Signed)
PT Cancellation Note  Patient Details Name: Lori Martinez MRN: [id number] DOB: Jan 04, 1951   Cancelled Treatment:    Reason Eval/Treat Not Completed: Other (comment).  Pt awaiting Brace to be delivered.  Will try back another time for PT eval.     Jaiana Sheffer, Thornton Papas 10/21/2014, 11:17 AM

## 2014-10-21 NOTE — Progress Notes (Signed)
OT NOTE  Order in order set for brace currently. NO brace present in room. Please follow order set instructions to call ortho tech [phone])  to order brace. OT to return to complete evaluation upon brace arrival.   Jeri Modena   OTR/L Pager: [phone] Office: [phone] .

## 2014-10-21 NOTE — Care Management Note (Signed)
Case Management Note  Patient Details  Name: Janki Dike Petrizzo MRN: [id number] Date of Birth: 12-Dec-1950  Subjective/Objective:        Admitted for Surgery            Action/Plan: CM following for DCP; awaiting for PT/ OT evals for disposition needs.  Expected Discharge Date:       Possibly 10/24/2014           Expected Discharge Plan:   Possibly home with [place]   Status of Service:   In progress   Sherrilyn Rist [phone] 10/21/2014, 10:54 AM

## 2014-10-21 NOTE — Progress Notes (Signed)
Patient received from PACU, alert and slightly drowsy. Pt oriented to room and equipment. MAEW. No c/o numbness post-op. Honeycomb dressing to back clean, dry and intact. VSS. All safety measures in place. Will monitor closely overnight.

## 2014-10-22 LAB — CBC WITH DIFFERENTIAL/PLATELET
BASOS ABS: 0 10*3/uL (ref 0.0–0.1)
Basophils Relative: 0 % (ref 0–1)
Eosinophils Absolute: 0.3 10*3/uL (ref 0.0–0.7)
Eosinophils Relative: 3 % (ref 0–5)
HEMATOCRIT: 26.5 % — AB (ref 36.0–46.0)
Hemoglobin: 8.6 g/dL — ABNORMAL LOW (ref 12.0–15.0)
Lymphocytes Relative: 17 % (ref 12–46)
Lymphs Abs: 2.1 10*3/uL (ref 0.7–4.0)
MCH: 28.8 pg (ref 26.0–34.0)
MCHC: 32.5 g/dL (ref 30.0–36.0)
MCV: 88.6 fL (ref 78.0–100.0)
MONOS PCT: 13 % — AB (ref 3–12)
Monocytes Absolute: 1.5 10*3/uL — ABNORMAL HIGH (ref 0.1–1.0)
NEUTROS ABS: 8.3 10*3/uL — AB (ref 1.7–7.7)
NEUTROS PCT: 67 % (ref 43–77)
PLATELETS: 218 10*3/uL (ref 150–400)
RBC: 2.99 MIL/uL — ABNORMAL LOW (ref 3.87–5.11)
RDW: 13 % (ref 11.5–15.5)
WBC: 12.3 10*3/uL — ABNORMAL HIGH (ref 4.0–10.5)

## 2014-10-22 MED ORDER — BISACODYL 5 MG PO TBEC
5.0000 mg | DELAYED_RELEASE_TABLET | Freq: Every day | ORAL | Status: DC | PRN
  Administered 2014-10-22: 5 mg via ORAL
  Filled 2014-10-22: qty 1

## 2014-10-22 NOTE — Progress Notes (Signed)
Occupational Therapy Treatment Patient Details Name: Lori Martinez MRN: [id number] DOB: 02-Dec-1950 Today's Date: 10/22/2014    History of present illness 64 yo female s/p L 2-3 fusion with brace PMH: CA HTN DM   OT comments  Pt demonstrates bed mobility , AE use for LB and tub transfer this session. Pt is at adequate level for d/c home from OT standpoint. OT will continue to see acutely to address balance deficits and adl retraining with back precautions at sink level .    Follow Up Recommendations  Home health OT;Supervision/Assistance - 24 hour    Equipment Recommendations  Other (comment) (RW)    Recommendations for Other Services      Precautions / Restrictions Precautions Precautions: Back Required Braces or Orthoses: Spinal Brace Spinal Brace: Lumbar corset;Applied in sitting position       Mobility Bed Mobility Overal bed mobility: Needs Assistance Bed Mobility: Supine to Sit     Supine to sit: Min guard     General bed mobility comments: able to don brace and bed rails removed  Transfers Overall transfer level: Needs assistance Equipment used: Rolling walker (2 wheeled) Transfers: Sit to/from Stand Sit to Stand: Supervision         General transfer comment: poor return demo of hand placement    Balance                                   ADL                       Lower Body Dressing: Modified independent;Sit to/from stand;With adaptive equipment Lower Body Dressing Details (indicate cue type and reason): husband to purchase AE Toilet Transfer: Supervision/safety;Ambulation;RW;BSC       Tub/ Shower Transfer: Minimal assistance;Shower seat;Ambulation;Rolling walker Tub/Shower Transfer Details (indicate cue type and reason): pt with LOB with vision occlusion so recommend sitting for shower Functional mobility during ADLs: Supervision/safety;Rolling walker General ADL Comments: Pt completed bed mobility dressing LB with ae and  shower transfer this session. pt don brace MOD I. Pt is at adequate level for d/c home      Vision                     Perception     Praxis      Cognition   Behavior During Therapy: [facility] for tasks assessed/performed Overall Cognitive Status: Within Functional Limits for tasks assessed                       Extremity/Trunk Assessment               Exercises     Shoulder Instructions       General Comments      Pertinent Vitals/ Pain       Pain Score: 5   Home Living                                          Prior Functioning/Environment              Frequency Min 2X/week     Progress Toward Goals  OT Goals(current goals can now be found in the care plan section)  Progress towards OT goals: Progressing toward goals  Acute Rehab OT Goals Patient Stated Goal:  Get home with grandchildren OT Goal Formulation: With patient/family Time For Goal Achievement: 11/04/14 Potential to Achieve Goals: Good ADL Goals Pt Will Perform Lower Body Bathing: with supervision;sit to/from stand;with adaptive equipment Pt Will Transfer to Toilet: with min guard assist;bedside commode;ambulating Pt Will Perform Tub/Shower Transfer: Tub transfer;rolling walker;ambulating;with min assist Additional ADL Goal #1: PT will complete bed mobility with HOB < 20 degrees no rail supervision level  Plan Discharge plan remains appropriate    Co-evaluation                 End of Session Equipment Utilized During Treatment: Gait belt;Rolling walker;Back brace   Activity Tolerance Patient tolerated treatment well   Patient Left Other (comment)   Nurse Communication Mobility status;Precautions        Time: 9597-4718 OT Time Calculation (min): 34 min  Charges: OT General Charges $OT Visit: 1 Procedure OT Treatments $Self Care/Home Management : 23-37 mins  Parke Poisson B 10/22/2014, 2:48 PM Pager: [phone]

## 2014-10-22 NOTE — Progress Notes (Signed)
Patient ID: Lori Martinez, female   DOB: [birth year], 64 y.o.   MRN: [id number] C/o incisional pain, no weakness. Continue pt/ot

## 2014-10-22 NOTE — Progress Notes (Signed)
MD notified about the CBC result.

## 2014-10-22 NOTE — Progress Notes (Signed)
Physical Therapy Treatment Patient Details Name: Maicie Vanderloop Collingsworth MRN: [id number] DOB: Apr 11, 1951 Today's Date: 10/22/2014    History of Present Illness 64 yo female s/p L 2-3 fusion with brace PMH: CA HTN DM    PT Comments    Pt with increased pain today and indicates she didn't take any pain meds all night and just took some prior to PT arrival.  Pt demonstrates good awareness of safety with mobility, just limited by pain this am.  Will continue to follow.    Follow Up Recommendations  Home health PT;Supervision/Assistance - 24 hour     Equipment Recommendations  Rolling walker with 5" wheels    Recommendations for Other Services       Precautions / Restrictions Precautions Precautions: Back Precaution Comments: Reviewed back precautions. Required Braces or Orthoses: Spinal Brace Spinal Brace: Lumbar corset;Applied in sitting position Restrictions Weight Bearing Restrictions: No    Mobility  Bed Mobility Overal bed mobility: Needs Assistance Bed Mobility: Sit to Sidelying         Sit to sidelying: Min assist General bed mobility comments: A with LEs returning to bed.    Transfers Overall transfer level: Needs assistance Equipment used: Rolling walker (2 wheeled) Transfers: Sit to/from Stand Sit to Stand: Min guard         General transfer comment: pt demos better use of hands and technique today.    Ambulation/Gait Ambulation/Gait assistance: Min guard Ambulation Distance (Feet): 30 Feet Assistive device: Rolling walker (2 wheeled) Gait Pattern/deviations: Step-through pattern;Decreased stride length;Trunk flexed     General Gait Details: cues for upright posture and positioning in RW.  Ambulation limited today by increased pain.     Stairs            Wheelchair Mobility    Modified Rankin (Stroke Patients Only)       Balance Overall balance assessment: Needs assistance Sitting-balance support: No upper extremity supported;Feet  supported Sitting balance-Leahy Scale: Good     Standing balance support: During functional activity;No upper extremity supported Standing balance-Leahy Scale: Fair                      Cognition Arousal/Alertness: Awake/alert Behavior During Therapy: WFL for tasks assessed/performed Overall Cognitive Status: Within Functional Limits for tasks assessed                      Exercises      General Comments        Pertinent Vitals/Pain Pain Assessment: 0-10 Pain Score: 8  Pain Location: Low back Pain Descriptors / Indicators: Constant;Aching;Grimacing Pain Intervention(s): Monitored during session;Premedicated before session;Repositioned    Home Living                      Prior Function            PT Goals (current goals can now be found in the care plan section) Acute Rehab PT Goals Patient Stated Goal: Get home with grandchildren PT Goal Formulation: With patient Time For Goal Achievement: 10/28/14 Potential to Achieve Goals: Good Progress towards PT goals: Progressing toward goals    Frequency  Min 5X/week    PT Plan Current plan remains appropriate    Co-evaluation             End of Session Equipment Utilized During Treatment: Gait belt;Back brace Activity Tolerance: Patient limited by pain Patient left: in bed;with call bell/phone within reach;with bed alarm set  Time: 7948-0165 PT Time Calculation (min) (ACUTE ONLY): 21 min  Charges:  $Gait Training: 8-22 mins                    G CodesCatarina Hartshorn, [place] 10/22/2014, 9:24 AM

## 2014-10-22 NOTE — Progress Notes (Signed)
Patient's BP is low at 75/39. Patient asymptomatic at this time. MD called to notify, still awaiting reply.Marland Kitchen

## 2014-10-22 NOTE — Progress Notes (Signed)
Patient's BP at this time is 87/25 manually, patient alert and oriented x 4 not showing any symptoms. MD called to notify, he advised to just monitor the patient and report if there are any changes. Will continue to monitor.

## 2014-10-23 MED ORDER — CYCLOBENZAPRINE HCL 10 MG PO TABS: 10.0000 mg | ORAL_TABLET | Freq: Three times a day (TID) | ORAL | Status: DC | PRN

## 2014-10-23 MED ORDER — OXYCODONE-ACETAMINOPHEN 5-325 MG PO TABS: 1.0000 | ORAL_TABLET | Freq: Four times a day (QID) | ORAL | Status: DC | PRN

## 2014-10-23 NOTE — Progress Notes (Signed)
Patient being d/c home. D/c instruction given and patient verbalized understanding. Patient wheeled out of the unit by the volunteer. Patient left with all belongings at bed side. Condition is stable.

## 2014-10-23 NOTE — Progress Notes (Signed)
Physical Therapy Treatment Patient Details Name: Lori Martinez MRN: [id number] DOB: [birth year] Today's Date: 10/23/2014    History of Present Illness 64 yo female s/p L 2-3 fusion with brace PMH: CA HTN DM    PT Comments    Progressing well towards physical therapy goals. Safely completed stair training and focused on safe gait mechanics, ambulating up to 260 feet today. No loss of balance noted. Tolerates exercises well and recalls 2/3 precautions which were again reviewed with handout at end of therapy. Pt will have assist from husband at home. Feel she is adequate for d/c from a mobility standpoint.   Follow Up Recommendations  Home health PT;Supervision/Assistance - 24 hour     Equipment Recommendations  Rolling walker with 5" wheels    Recommendations for Other Services       Precautions / Restrictions Precautions Precautions: Back Precaution Comments: Reviewed back precautions. recalls 2/3 Required Braces or Orthoses: Spinal Brace Spinal Brace: Lumbar corset;Applied in sitting position Restrictions Weight Bearing Restrictions: No    Mobility  Bed Mobility Overal bed mobility: Needs Assistance Bed Mobility: Rolling;Sidelying to Sit Rolling: Supervision Sidelying to sit: Supervision       General bed mobility comments: Supervision for safety. VC for log roll technique. No physical assist required but uses rail to rise.  Transfers Overall transfer level: Needs assistance Equipment used: Rolling walker (2 wheeled) Transfers: Sit to/from Stand Sit to Stand: Supervision         General transfer comment: Supervision for safety. VC for hand placement. No assist needed. Slow to rise. Good control with descent into chair.  Ambulation/Gait Ambulation/Gait assistance: Supervision Ambulation Distance (Feet): 260 Feet Assistive device: Rolling walker (2 wheeled) Gait Pattern/deviations: Step-through pattern;Decreased stride length;Trendelenburg Gait velocity: slow    General Gait Details: Notable trendelenburg on Lt. Tactile cues in Lt stance phase for glute medius activation which greatly improves mechanics, demonstrating strong carryover after cues and ambulating without feedback. No buckling noted. VC for walker placement.   Stairs Stairs: Yes Stairs assistance: Min guard Stair Management: No rails;Step to pattern;Backwards;With walker Number of Stairs: 1 General stair comments: Pt has single step in home and a step-stool to get into bed. Practiced this backwards approach task with min guard and cues for technique. States she feels confident with this task.  Wheelchair Mobility    Modified Rankin (Stroke Patients Only)       Balance                                    Cognition Arousal/Alertness: Awake/alert Behavior During Therapy: WFL for tasks assessed/performed Overall Cognitive Status: Within Functional Limits for tasks assessed                      Exercises General Exercises - Lower Extremity Ankle Circles/Pumps: AROM;Both;5 reps;Seated Short Arc Quad: Strengthening;Both;5 reps;Seated    General Comments General comments (skin integrity, edema, etc.): Reviewed back precautions      Pertinent Vitals/Pain Pain Assessment: 0-10 Pain Score:  ("I'm alright" no value given) Pain Location: back Pain Intervention(s): Monitored during session;Repositioned    Home Living                      Prior Function            PT Goals (current goals can now be found in the care plan section) Acute Rehab PT Goals Patient  Stated Goal: Get home with grandchildren PT Goal Formulation: With patient Time For Goal Achievement: 10/28/14 Potential to Achieve Goals: Good Progress towards PT goals: Progressing toward goals    Frequency  Min 5X/week    PT Plan Current plan remains appropriate    Co-evaluation             End of Session Equipment Utilized During Treatment: Gait belt;Back  brace Activity Tolerance: Patient tolerated treatment well Patient left: with call bell/phone within reach;in chair;with chair alarm set;with nursing/sitter in room     Time: 0903-0923 PT Time Calculation (min) (ACUTE ONLY): 20 min  Charges:  $Gait Training: 8-22 mins                    G Codes:      Lori Martinez 11/16/2014, 9:42 AM Lori Martinez, [place]

## 2014-10-23 NOTE — Discharge Summary (Signed)
Physician Discharge Summary  Patient ID: Lori Martinez MRN: [id number] DOB/AGE: [birth year] 65 y.o.  Admit date: 10/20/2014 Discharge date: 10/23/2014  Admission Diagnoses: Lumbar stenosis    Discharge Diagnoses: Same   Discharged Condition: good  Hospital Course: The patient was admitted on 10/20/2014 and taken to the operating room where the patient underwent PLIF L2-3. The patient tolerated the procedure well and was taken to the recovery room and then to the floor in stable condition. The hospital course was routine. There were no complications. The wound remained clean dry and intact. Pt had appropriate back soreness. No complaints of leg pain or new N/T/W. The patient remained afebrile and had some decreased blood pressure which had normalized by the day of discharge, and tolerated a regular diet. The patient continued to increase activities, and pain was well controlled with oral pain medications.   Consults: None  Significant Diagnostic Studies:  Results for orders placed or performed during the hospital encounter of 10/20/14  Glucose, capillary  Result Value Ref Range   Glucose-Capillary 87 70 - 99 mg/dL  Glucose, capillary  Result Value Ref Range   Glucose-Capillary 153 (H) 70 - 99 mg/dL   Comment 1 Notify RN    Comment 2 Document in Chart   CBC with Differential/Platelet  Result Value Ref Range   WBC 12.3 (H) 4.0 - 10.5 K/uL   RBC 2.99 (L) 3.87 - 5.11 MIL/uL   Hemoglobin 8.6 (L) 12.0 - 15.0 g/dL   HCT 26.5 (L) 36.0 - 46.0 %   MCV 88.6 78.0 - 100.0 fL   MCH 28.8 26.0 - 34.0 pg   MCHC 32.5 30.0 - 36.0 g/dL   RDW 13.0 11.5 - 15.5 %   Platelets 218 150 - 400 K/uL   Neutrophils Relative % 67 43 - 77 %   Neutro Abs 8.3 (H) 1.7 - 7.7 K/uL   Lymphocytes Relative 17 12 - 46 %   Lymphs Abs 2.1 0.7 - 4.0 K/uL   Monocytes Relative 13 (H) 3 - 12 %   Monocytes Absolute 1.5 (H) 0.1 - 1.0 K/uL   Eosinophils Relative 3 0 - 5 %   Eosinophils Absolute 0.3 0.0 - 0.7 K/uL   Basophils  Relative 0 0 - 1 %   Basophils Absolute 0.0 0.0 - 0.1 K/uL  I-STAT 4, (NA,K, GLUC, HGB,HCT)  Result Value Ref Range   Sodium 136 135 - 145 mmol/L   Potassium 4.0 3.5 - 5.1 mmol/L   Glucose, Bld 132 (H) 70 - 99 mg/dL   HCT 34.0 (L) 36.0 - 46.0 %   Hemoglobin 11.6 (L) 12.0 - 15.0 g/dL    Dg Lumbar Spine 2-3 Views  10/20/2014   CLINICAL DATA:  PLIF to L2-3  EXAM: LUMBAR SPINE - 2-3 VIEW; DG C-ARM 61-120 MIN  COMPARISON:  None.  FLUOROSCOPY TIME:  Time:  53 seconds.  Total images:  1  FINDINGS: Single cross-table lateral view of the lumbar spine is provided. There is a posterior metallic probe with the tip projecting along the posterior margin of the L2-3 disc space. There is remote posterior lumbar fusion from L3 through S1.  Single fluoroscopic frontal view of the lumbar spine demonstrates bilobed pedicle screws at L2.  IMPRESSION: Interval PLIF at L2-3.   Electronically Signed   By: Kathreen Devoid   On: 10/20/2014 15:55   Mr Cervical Spine Wo Contrast  09/25/2014   CLINICAL DATA:  64 year old female with right cervical neck pain radiating to the right shoulder.  Weakness and numbness in the right upper extremity. Occipital headaches. Initial encounter.  EXAM: MRI CERVICAL SPINE WITHOUT CONTRAST  TECHNIQUE: Multiplanar, multisequence MR imaging of the cervical spine was performed. No intravenous contrast was administered.  COMPARISON:  Cervical spine CT 04/22/2014.  Brain MRI 08/16/2010.  FINDINGS: Stable vertebral height and alignment with mild straightening of lordosis. No marrow edema or evidence of acute osseous abnormality.  Cervicomedullary junction is within normal limits. No spinal cord signal abnormality.  Negative paraspinal soft tissues. Dominant left and diminutive right vertebral arteries.  C2-C3: Mild facet hypertrophy greater on the right. No significant stenosis.  C3-C4:  Minimal to mild facet hypertrophy.  No stenosis.  C4-C5: Disc space loss. Circumferential disc osteophyte complex.  Broad-based posterior component of disc. Uncovertebral hypertrophy. No significant spinal stenosis. Moderate bilateral C5 foraminal stenosis.  C5-C6: Disc space loss. Circumferential disc osteophyte complex. Broad-based posterior component of disc. Mild ligament flavum hypertrophy. Spinal stenosis without spinal cord mass effect. Moderate bilateral C6 foraminal stenosis.  C6-C7: Disc space loss. Circumferential disc osteophyte complex with broad-based posterior component of disc. Mild to moderate ligament flavum hypertrophy. Spinal stenosis with no cord mass effect. Mild bilateral C7 foraminal stenosis.  C7-T1: Trace anterolisthesis with broad-based disc protrusion best seen on series 7, image 22. Narrowing of the ventral CSF space without significant spinal stenosis. No foraminal stenosis.  No upper thoracic spinal stenosis. There is a T3-T4 midline upper thoracic small disc extrusion associated with ligament flavum hypertrophy (series 3, image 7).  IMPRESSION: 1. 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 No cord mass effect or signal abnormality. Up to moderate associated bilateral foraminal stenosis. 2. Small C7-T1 disc herniation without stenosis. 3. Upper thoracic disc herniation at T3-T4 without spinal stenosis.   Electronically Signed   By: Genevie Ann M.D.   On: 09/25/2014 09:19   Dg Lumbar Spine 1 View  10/20/2014   CLINICAL DATA:  Postop from spinal fusion.  EXAM: LUMBAR SPINE - 1 VIEW  COMPARISON:  10/20/2014 at 1316 hours  FINDINGS: Previous fusion from L3 through S1 as revised. Prior fusion hardware persists from L4 through S1. There are interconnecting rods and pedicle screws at L2 and L3, with the L2 pedicle screws and interconnecting rods new, fusing of did L3.  The orthopedic hardware is well-seated and aligned.  IMPRESSION: Operative imaging from spinal fusion at L2 and L3 with previous fusion noted from L4 through S1.   Electronically Signed   By: Lajean Manes M.D.   On: 10/20/2014 17:06   Dg C-arm 1-60 Min  10/20/2014   CLINICAL DATA:  PLIF to L2-3  EXAM: LUMBAR SPINE - 2-3 VIEW; DG C-ARM 61-120 MIN  COMPARISON:  None.  FLUOROSCOPY TIME:  Time:  53 seconds.  Total images:  1  FINDINGS: Single cross-table lateral view of the lumbar spine is provided. There is a posterior metallic probe with the tip projecting along the posterior margin of the L2-3 disc space. There is remote posterior lumbar fusion from L3 through S1.  Single fluoroscopic frontal view of the lumbar spine demonstrates bilobed pedicle screws at L2.  IMPRESSION: Interval PLIF at L2-3.   Electronically Signed   By: Kathreen Devoid   On: 10/20/2014 15:55    Antibiotics:  Anti-infectives    Start     Dose/Rate Route Frequency Ordered Stop   10/20/14 2200  vancomycin (VANCOCIN) IVPB 1000 mg/200 mL premix     1,000 mg 200 mL/hr over 60  Minutes Intravenous  Once 10/20/14 2013 10/20/14 2233   10/20/14 1000  vancomycin (VANCOCIN) IVPB 1000 mg/200 mL premix  Status:  Discontinued     1,000 mg 200 mL/hr over 60 Minutes Intravenous To Neuro OR-Station #32 10/20/14 0946 10/20/14 1944   10/20/14 0930  vancomycin (VANCOCIN) IVPB 1000 mg/200 mL premix     1,000 mg 200 mL/hr over 60 Minutes Intravenous To Surgery 10/19/14 1258 10/20/14 1125      Discharge Exam: Blood pressure 101/44, pulse 104, temperature 98.4 F (36.9 C), temperature source Oral, resp. rate 20, height 5\' 4"  (1.626 m), weight 204 lb 2.3 oz (92.6 kg), SpO2 96 %. Neurologic: Grossly normal Dressing dry  Discharge Medications:     Medication List    TAKE these medications        albuterol 108 (90 BASE) MCG/ACT inhaler  Commonly known as:  PROVENTIL HFA;VENTOLIN HFA  Inhale 1-2 puffs into the lungs every 6 (six) hours as needed for wheezing or shortness of breath.     CENTRUM SILVER PO  Take by mouth daily.     CRANBERRY PO  Take 1 tablet by mouth daily.     cyclobenzaprine 10 MG tablet  Commonly known as:   FLEXERIL  Take 1 tablet (10 mg total) by mouth 3 (three) times daily as needed for muscle spasms.     FLUoxetine 10 MG capsule  Commonly known as:  PROZAC  Take 10 mg by mouth daily.     gabapentin 300 MG capsule  Commonly known as:  NEURONTIN  Take 300 mg by mouth 3 (three) times daily.     oxyCODONE-acetaminophen 5-325 MG per tablet  Commonly known as:  PERCOCET/ROXICET  Take 1-2 tablets by mouth every 6 (six) hours as needed for moderate pain.     predniSONE 20 MG tablet  Commonly known as:  DELTASONE  Take 2 tablets (40 mg total) by mouth daily.     pregabalin 75 MG capsule  Commonly known as:  LYRICA  Take 75 mg by mouth 2 (two) times daily.     ramipril 10 MG tablet  Commonly known as:  ALTACE  Take 20 mg by mouth daily. Pt reports taking 20 mg qd        Disposition: Home   Final Dx: PLIF      Discharge Instructions    Call MD for:  difficulty breathing, headache or visual disturbances    Complete by:  As directed      Call MD for:  persistant nausea and vomiting    Complete by:  As directed      Call MD for:  redness, tenderness, or signs of infection (pain, swelling, redness, odor or green/yellow discharge around incision site)    Complete by:  As directed      Call MD for:  severe uncontrolled pain    Complete by:  As directed      Call MD for:  temperature >100.4    Complete by:  As directed      Diet - low sodium heart healthy    Complete by:  As directed      Discharge instructions    Complete by:  As directed   No driving, no heavy lifting, no strenuous activity, no bending or twisting     Increase activity slowly    Complete by:  As directed      Remove dressing in 24 hours    Complete by:  As directed  Follow-up Information    Follow up with CABBELL,KYLE L, MD. Schedule an appointment as soon as possible for a visit in 2 weeks.   Specialty:  Neurosurgery   Contact information:   [address] [place] [address] [place]  [address] [phone]        Signed: Eustace Moore 10/23/2014, 8:02 AM

## 2015-07-08 ENCOUNTER — Other Ambulatory Visit: Payer: Self-pay | Admitting: Neurosurgery

## 2015-07-08 DIAGNOSIS — M4316 Spondylolisthesis, lumbar region: Secondary | ICD-10-CM

## 2015-07-18 ENCOUNTER — Ambulatory Visit
Admission: RE | Admit: 2015-07-18 | Discharge: 2015-07-18 | Disposition: A | Discharge status: Home / Self Care | Payer: 59 | Source: Ambulatory Visit | Attending: Neurosurgery | Admitting: Neurosurgery

## 2015-07-18 DIAGNOSIS — M4316 Spondylolisthesis, lumbar region: Secondary | ICD-10-CM

## 2015-07-18 DIAGNOSIS — M5126 Other intervertebral disc displacement, lumbar region: Secondary | ICD-10-CM | POA: Diagnosis not present

## 2015-07-18 MED ORDER — GADOBENATE DIMEGLUMINE 529 MG/ML IV SOLN
20.0000 mL | Freq: Once | INTRAVENOUS | Status: AC | PRN
  Administered 2015-07-18: 20 mL via INTRAVENOUS

## 2015-09-28 ENCOUNTER — Other Ambulatory Visit (HOSPITAL_COMMUNITY): Payer: Self-pay | Admitting: Neurosurgery

## 2015-09-28 ENCOUNTER — Other Ambulatory Visit: Payer: Self-pay | Admitting: Neurosurgery

## 2015-09-28 DIAGNOSIS — M48062 Spinal stenosis, lumbar region with neurogenic claudication: Secondary | ICD-10-CM

## 2015-09-28 DIAGNOSIS — G9519 Other vascular myelopathies: Secondary | ICD-10-CM

## 2015-09-28 DIAGNOSIS — M542 Cervicalgia: Secondary | ICD-10-CM

## 2015-10-07 ENCOUNTER — Ambulatory Visit (HOSPITAL_COMMUNITY)
Admission: RE | Admit: 2015-10-07 | Discharge: 2015-10-07 | Disposition: A | Discharge status: Home / Self Care | Payer: 59 | Source: Ambulatory Visit | Attending: Neurosurgery | Admitting: Neurosurgery

## 2015-10-07 DIAGNOSIS — M5126 Other intervertebral disc displacement, lumbar region: Secondary | ICD-10-CM | POA: Insufficient documentation

## 2015-10-07 DIAGNOSIS — M4802 Spinal stenosis, cervical region: Secondary | ICD-10-CM | POA: Diagnosis not present

## 2015-10-07 DIAGNOSIS — M47892 Other spondylosis, cervical region: Secondary | ICD-10-CM | POA: Insufficient documentation

## 2015-10-07 DIAGNOSIS — M50321 Other cervical disc degeneration at C4-C5 level: Secondary | ICD-10-CM | POA: Insufficient documentation

## 2015-10-07 DIAGNOSIS — M542 Cervicalgia: Secondary | ICD-10-CM | POA: Diagnosis present

## 2015-10-07 DIAGNOSIS — M5125 Other intervertebral disc displacement, thoracolumbar region: Secondary | ICD-10-CM | POA: Diagnosis not present

## 2015-10-07 DIAGNOSIS — M545 Low back pain: Secondary | ICD-10-CM | POA: Diagnosis present

## 2015-10-07 DIAGNOSIS — Z981 Arthrodesis status: Secondary | ICD-10-CM | POA: Insufficient documentation

## 2015-10-07 DIAGNOSIS — M5023 Other cervical disc displacement, cervicothoracic region: Secondary | ICD-10-CM | POA: Insufficient documentation

## 2015-10-07 MED ORDER — DIAZEPAM 5 MG PO TABS
ORAL_TABLET | ORAL | Status: AC
  Filled 2015-10-07: qty 2

## 2015-10-07 MED ORDER — LIDOCAINE HCL (PF) 1 % IJ SOLN
INTRAMUSCULAR | Status: AC
  Administered 2015-10-07: 5 mL via INTRAMUSCULAR
  Filled 2015-10-07: qty 5

## 2015-10-07 MED ORDER — ONDANSETRON HCL 4 MG/2ML IJ SOLN: 4.0000 mg | Freq: Four times a day (QID) | INTRAMUSCULAR | Status: DC | PRN

## 2015-10-07 MED ORDER — IOHEXOL 300 MG/ML  SOLN
10.0000 mL | Freq: Once | INTRAMUSCULAR | Status: AC | PRN
  Administered 2015-10-07: 10 mL via INTRATHECAL

## 2015-10-07 MED ORDER — DIAZEPAM 5 MG PO TABS
10.0000 mg | ORAL_TABLET | Freq: Once | ORAL | Status: AC
  Administered 2015-10-07: 10 mg via ORAL

## 2015-10-07 MED ORDER — OXYCODONE HCL 5 MG PO TABS
5.0000 mg | ORAL_TABLET | ORAL | Status: DC | PRN
  Administered 2015-10-07: 5 mg via ORAL
  Filled 2015-10-07 (×2): qty 2

## 2015-10-07 MED ORDER — OXYCODONE HCL 5 MG PO TABS
ORAL_TABLET | ORAL | Status: AC
  Filled 2015-10-07: qty 1

## 2015-10-07 NOTE — Progress Notes (Addendum)
Pt complaining of neck and headache post myelogram 7 out of 10.  No pain meds ordered.  Dr Christella Noa paged.  Lumbar bandaid CDI

## 2015-10-07 NOTE — Discharge Instructions (Signed)
Myelogram and Lumbar Puncture Discharge Instructions  1. Go home and rest quietly for the next 24 hours.  It is important to lie flat for the next 24 hours.  Get up only to go to the restroom.  You Ninneman lie in the bed or on a couch on your back, your stomach, your left side or your right side.  You Kauzlarich have one pillow under your head.  You Durnin have pillows between your knees while you are on your side or under your knees while you are on your back.  2. DO NOT drive today.  Recline the seat as far back as it will go, while still wearing your seat belt, on the way home.  3. You Dillingham get up to go to the bathroom as needed.  You Hulon sit up for 10 minutes to eat.  You Ze[place] resume your normal diet and medications unless otherwise indicated.  4. The incidence of headache, nausea, or vomiting is about 5% (one in 20 patients).  If you develop a headache, lie flat and drink plenty of fluids until the headache goes away.  Caffeinated beverages [place] be helpful.  If you develop severe nausea and vomiting or a headache that does not go away with flat bed rest, call [phone]  5. You Geissinger resume normal activities after your 24 hours of bed rest is over; however, do not exert yourself strongly or do any heavy lifting tomorrow.  6. Call your physician for a follow-up appointment.  The results of your myelogram will be sent directly to your physician by the following day.  If you have any questions or if complications develop after you arrive home, please call [phone]  Discharge instructions have been explained to the patient.  The patient, or the person responsible for the patient, fully understands these instructions.

## 2015-10-07 NOTE — Op Note (Signed)
10/07/2015 Lumbar and Cervical Myelogram  PATIENT:  Lori Martinez is a 65 y.o. female with persistent pain in the neck and lower extremities  PRE-OPERATIVE DIAGNOSIS:  Cervicalgia, lumbago  POST-OPERATIVE DIAGNOSIS:  Cervicalgia, lumbago  PROCEDURE:  Lumbar Myelogram  SURGEON:  Jeiden Daughtridge  ANESTHESIA:   local LOCAL MEDICATIONS USED:  LIDOCAINE  and Amount: 8 ml Procedure Note: Lori Martinez is a 64 y.o. female Was taken to the fluoroscopy suite and  positioned prone on the fluoroscopy table. Her back was prepared and draped in a sterile manner. I infiltrated 10 cc into the lumbar region. I then introduced a spinal needle into the thecal sac at the L3/4 interlaminar space. I infiltrated 10cc of Omnipaque 300 into the thecal sac. Fluoroscopy showed the needle and contrast in the thecal sac. Lori Martinez tolerated the procedure well. she Will be taken to CT for evaluation.     PATIENT DISPOSITION:  PACU - hemodynamically stable.

## 2015-10-07 NOTE — Progress Notes (Signed)
Report given to [facility]

## 2015-12-02 ENCOUNTER — Other Ambulatory Visit: Payer: Self-pay | Admitting: Family Medicine

## 2015-12-02 DIAGNOSIS — Z1231 Encounter for screening mammogram for malignant neoplasm of breast: Secondary | ICD-10-CM

## 2015-12-02 DIAGNOSIS — R5381 Other malaise: Secondary | ICD-10-CM

## 2015-12-06 ENCOUNTER — Other Ambulatory Visit (HOSPITAL_COMMUNITY): Payer: Self-pay | Admitting: Family Medicine

## 2015-12-06 DIAGNOSIS — Z1231 Encounter for screening mammogram for malignant neoplasm of breast: Secondary | ICD-10-CM

## 2015-12-09 ENCOUNTER — Other Ambulatory Visit: Payer: Self-pay | Admitting: Orthopedic Surgery

## 2015-12-21 NOTE — Pre-Procedure Instructions (Signed)
Lori Martinez  12/21/2015      [id number] - [place], [place] - [address] AT [address] [place] [address] Phone: [phone] Fax: [phone]    Your procedure is scheduled on Thursday, December 29, 2015  Report to [facility] Admitting at 5: 30 A.M.  Call this number if you have problems the morning of surgery:  [phone]   Remember:  Do not eat food or drink liquids after midnight Wednesday, December 28, 2015  Take these medicines the morning of surgery with A SIP OF WATER : FLUoxetine (PROZAC), pregabalin (LYRICA), if needed: albuterol (PROVENTIL HFA;VENTOLIN HFA)  Inhaler for wheezing or shortness of breath ( Bring inhaler in with you on day of surgery).  Stop taking Aspirin, vitamins, Cranberry,  fish oil and herbal medications. Do not take any NSAIDs ie: Ibuprofen, Advil, Naproxen, BC and Goody Powder or any medication containing Aspirin; stop now.   How to Manage Your Diabetes Before and After Surgery  Why is it important to control my blood sugar before and after surgery? . Improving blood sugar levels before and after surgery helps healing and can limit problems. . A way of improving blood sugar control is eating a healthy diet by: o  Eating less sugar and carbohydrates o  Increasing activity/exercise o  Talking with your doctor about reaching your blood sugar goals . High blood sugars (greater than 180 mg/dL) can raise your risk of infections and slow your recovery, so you will need to focus on controlling your diabetes during the weeks before surgery. . Make sure that the doctor who takes care of your diabetes knows about your planned surgery including the date and location.  How do I manage my blood sugar before surgery? . Check your blood sugar at least 4 times a day, starting 2 days before surgery, to make sure that the level is not too high or low. o Check your blood sugar the morning of your surgery when you wake up and  every 2 hours until you get to the Short Stay unit. . If your blood sugar is less than 70 mg/dL, you will need to treat for low blood sugar: o Do not take insulin. o Treat a low blood sugar (less than 70 mg/dL) with  cup of clear juice (cranberry or apple), 4 glucose tablets, OR glucose gel. o Recheck blood sugar in 15 minutes after treatment (to make sure it is greater than 70 mg/dL). If your blood sugar is not greater than 70 mg/dL on recheck, call [phone] for further instructions. . Report your blood sugar to the short stay nurse when you get to Short Stay.  . If you are admitted to the hospital after surgery: o Your blood sugar will be checked by the staff and you will probably be given insulin after surgery (instead of oral diabetes medicines) to make sure you have good blood sugar levels. o The goal for blood sugar control after surgery is 80-180 mg/dL.  Patient Signature:  Date:   Nurse Signature:  Date:   Reviewed and Endorsed by [facility] Patient Education Committee, August 2015  Do not wear jewelry, make-up or nail polish.  Do not wear lotions, powders, or perfumes.  You [place] not wear deoderant.  Do not shave 48 hours prior to surgery.    Do not bring valuables to the hospital.  [facility] is not responsible for any belongings or valuables.  Contacts, dentures or bridgework [place]  not be worn into surgery.  Leave your suitcase in the car.  After surgery it [place] be brought to your room.  For patients admitted to the hospital, discharge time will be determined by your treatment team.  Patients discharged the day of surgery will not be allowed to drive home.   Name and phone number of your driver:  Special instructions: Shower the night before surgery and shower the morning of surgery with CHG.  Please read over the following fact sheets that you were given. Pain Booklet, Coughing and Deep Breathing, Blood Transfusion Information, MRSA Information and Surgical Site  Infection Prevention

## 2015-12-22 ENCOUNTER — Encounter (HOSPITAL_COMMUNITY)
Admission: RE | Admit: 2015-12-22 | Discharge: 2015-12-22 | Disposition: A | Discharge status: Home / Self Care | Payer: 59 | Source: Ambulatory Visit | Attending: Orthopedic Surgery | Admitting: Orthopedic Surgery

## 2015-12-22 ENCOUNTER — Encounter (HOSPITAL_COMMUNITY): Payer: Self-pay

## 2015-12-22 DIAGNOSIS — Z01812 Encounter for preprocedural laboratory examination: Secondary | ICD-10-CM | POA: Insufficient documentation

## 2015-12-22 DIAGNOSIS — Z01810 Encounter for preprocedural cardiovascular examination: Secondary | ICD-10-CM | POA: Insufficient documentation

## 2015-12-22 DIAGNOSIS — Z01818 Encounter for other preprocedural examination: Secondary | ICD-10-CM

## 2015-12-22 HISTORY — DX: Other specific arthropathies, not elsewhere classified, unspecified shoulder: M12.819

## 2015-12-22 HISTORY — DX: Unspecified rotator cuff tear or rupture of unspecified shoulder, not specified as traumatic: M75.100

## 2015-12-22 HISTORY — DX: Pneumonia, unspecified organism: J18.9

## 2015-12-22 HISTORY — DX: Presence of spectacles and contact lenses: Z97.3

## 2015-12-22 LAB — CBC WITH DIFFERENTIAL/PLATELET
Basophils Absolute: 0 10*3/uL (ref 0.0–0.1)
Basophils Relative: 0 %
EOS PCT: 4 %
Eosinophils Absolute: 0.2 10*3/uL (ref 0.0–0.7)
HCT: 43 % (ref 36.0–46.0)
Hemoglobin: 13.6 g/dL (ref 12.0–15.0)
LYMPHS ABS: 1.9 10*3/uL (ref 0.7–4.0)
LYMPHS PCT: 28 %
MCH: 27.7 pg (ref 26.0–34.0)
MCHC: 31.6 g/dL (ref 30.0–36.0)
MCV: 87.6 fL (ref 78.0–100.0)
MONO ABS: 0.6 10*3/uL (ref 0.1–1.0)
MONOS PCT: 9 %
Neutro Abs: 3.9 10*3/uL (ref 1.7–7.7)
Neutrophils Relative %: 59 %
PLATELETS: 252 10*3/uL (ref 150–400)
RBC: 4.91 MIL/uL (ref 3.87–5.11)
RDW: 14.4 % (ref 11.5–15.5)
WBC: 6.6 10*3/uL (ref 4.0–10.5)

## 2015-12-22 LAB — COMPREHENSIVE METABOLIC PANEL
ALT: 24 U/L (ref 14–54)
AST: 30 U/L (ref 15–41)
Albumin: 3.8 g/dL (ref 3.5–5.0)
Alkaline Phosphatase: 71 U/L (ref 38–126)
Anion gap: 10 (ref 5–15)
BUN: 15 mg/dL (ref 6–20)
CHLORIDE: 104 mmol/L (ref 101–111)
CO2: 24 mmol/L (ref 22–32)
CREATININE: 0.83 mg/dL (ref 0.44–1.00)
Calcium: 9.5 mg/dL (ref 8.9–10.3)
Glucose, Bld: 84 mg/dL (ref 65–99)
POTASSIUM: 4 mmol/L (ref 3.5–5.1)
Sodium: 138 mmol/L (ref 135–145)
Total Bilirubin: 0.8 mg/dL (ref 0.3–1.2)
Total Protein: 7.2 g/dL (ref 6.5–8.1)

## 2015-12-22 LAB — URINE MICROSCOPIC-ADD ON: RBC / HPF: NONE SEEN RBC/hpf (ref 0–5)

## 2015-12-22 LAB — PROTIME-INR
INR: 1.02 (ref 0.00–1.49)
PROTHROMBIN TIME: 13.6 s (ref 11.6–15.2)

## 2015-12-22 LAB — URINALYSIS, ROUTINE W REFLEX MICROSCOPIC
Bilirubin Urine: NEGATIVE
GLUCOSE, UA: NEGATIVE mg/dL
Hgb urine dipstick: NEGATIVE
KETONES UR: NEGATIVE mg/dL
Nitrite: NEGATIVE
PH: 5.5 (ref 5.0–8.0)
Protein, ur: NEGATIVE mg/dL
SPECIFIC GRAVITY, URINE: 1.021 (ref 1.005–1.030)

## 2015-12-22 LAB — TYPE AND SCREEN
ABO/RH(D): O POS
Antibody Screen: NEGATIVE

## 2015-12-22 LAB — APTT: aPTT: 30 seconds (ref 24–37)

## 2015-12-22 LAB — SURGICAL PCR SCREEN
MRSA, PCR: NEGATIVE
STAPHYLOCOCCUS AUREUS: NEGATIVE

## 2015-12-22 LAB — GLUCOSE, CAPILLARY: Glucose-Capillary: 82 mg/dL (ref 65–99)

## 2015-12-22 NOTE — Progress Notes (Signed)
Pt denies SOB and chest pain. Pt stated that her fasting blood glucose usually ranges between 75-80. Pt stated that she last had labs drawn in [place] 2017 which included an A1c; records requested. Pt denies having a chest x ray and EKG within the last year. Pt chart forwarded to anesthesia for review of abnormal EKG ( stable).

## 2015-12-26 ENCOUNTER — Encounter (HOSPITAL_COMMUNITY): Payer: Self-pay

## 2015-12-26 NOTE — Progress Notes (Signed)
Anesthesia Chart Review: Patient is a 65 year old female scheduled for left reverse total shoulder on 12/29/15 by Dr. Tamera Punt.  History includes non-smoker, HLD, HTN, CHF (normal EF 2010), DM (no meds), GERD, hiatal hernia s/p repair and Nissen fundoplication [id number], chronic cough, change in voice (not specified), arthritis, colon/rectal cancer s/p surgery '90's, dysphagia, cholecystectomy, hysterectomy, L3-4 fusion 09/01/08, L2-3 PLIF 10/20/14. BMI is consistent with obesity. PCP is listed as Dr. Redmond School. Endocrinologist is Dr. Chalmers Cater.  Meds include albuterol, Flexeril, Prozac, Lyrica, Altace.  (Cardiac records from 2010 are scanned under Media tab, Correspondence, Encounter 03/02/11.) She was seen by cardiologist Dr. Tery Sanfilippo (formerly with [facility] now [facility]) back in 2010 for evaluation of atypical chest pain. Her studies were fairly unremarkable (see below) and no further cardiology follow-up was recommended at that time.  12/06/08 Echo: Mild LVH, LV systolic function is normal EF => 55%, the transmitral spectral Doppler flow pattern is normal for age., mild mitral annular calcification, trace MR, AV appears mildly sclerotic, mild TR, normal RVSP.   11/30/08 Nuclear stress test: The post stress LV is normal in size. The observed defect is consistent with breast attenuation artifact. Post stress LVF is normal, EF 76%. Unremarkable pharmacological stress test. This is a low risk scan.  12/22/15 EKG: NSR, LAD, moderate voltage criteria for LVH, [place] be normal variant, cannot rule out septal infarct (age undetermined). Overall, I think her EKG is stable when compared to her 02/23/11 and 10/12/14 tracings.  12/22/15 CXR: IMPRESSION: Bronchitic changes without infiltrate.  Preoperative labs noted. A1c on 11/02/15 was 5.7 (Dr. Chalmers Cater).  I think her EKG appears stable. Cardiac testing okay in 2010. If no acute changes or new CV symptoms then I would  anticipate that she could proceed as planned.  [facility] Short Stay Center/Anesthesiology Phone [phone] 12/26/2015 10:43 AM

## 2015-12-28 NOTE — Anesthesia Preprocedure Evaluation (Signed)
Anesthesia Evaluation  Patient identified by MRN, date of birth, ID band Patient awake    Reviewed: Allergy & Precautions, NPO status , Patient's Chart, lab work & pertinent test results  Airway Mallampati: I       Dental   Pulmonary    Pulmonary exam normal        Cardiovascular hypertension, Pt. on medications +CHF  Normal cardiovascular exam Rhythm:Regular     Neuro/Psych  Headaches,  Neuromuscular disease    GI/Hepatic GERD  ,  Endo/Other  diabetes, Type 2  Renal/GU      Musculoskeletal  (+) Arthritis ,   Abdominal   Peds  Hematology   Anesthesia Other Findings   Reproductive/Obstetrics                             Anesthesia Physical  Anesthesia Plan  ASA: III  Anesthesia Plan: General and Regional   Post-op Pain Management: GA combined w/ Regional for post-op pain   Induction: Intravenous  Airway Management Planned: Oral ETT  Additional Equipment:   Intra-op Plan:   Post-operative Plan: Extubation in OR  Informed Consent: I have reviewed the patients History and Physical, chart, labs and discussed the procedure including the risks, benefits and alternatives for the proposed anesthesia with the patient or authorized representative who has indicated his/her understanding and acceptance.   Dental advisory given  Plan Discussed with: CRNA  Anesthesia Plan Comments:         Anesthesia Quick Evaluation

## 2015-12-29 ENCOUNTER — Inpatient Hospital Stay (HOSPITAL_COMMUNITY): Payer: 59 | Admitting: Anesthesiology

## 2015-12-29 ENCOUNTER — Inpatient Hospital Stay (HOSPITAL_COMMUNITY)
Admission: RE | Admit: 2015-12-29 | Discharge: 2015-12-30 | DRG: 483 | Disposition: A | Discharge status: Home Health | Payer: 59 | Source: Ambulatory Visit | Attending: Orthopedic Surgery | Admitting: Orthopedic Surgery

## 2015-12-29 ENCOUNTER — Inpatient Hospital Stay (HOSPITAL_COMMUNITY): Payer: 59 | Admitting: Emergency Medicine

## 2015-12-29 ENCOUNTER — Inpatient Hospital Stay (HOSPITAL_COMMUNITY): Payer: 59

## 2015-12-29 ENCOUNTER — Encounter (HOSPITAL_COMMUNITY)
Admission: RE | Disposition: A | Discharge status: Home Health | Payer: Self-pay | Source: Ambulatory Visit | Attending: Orthopedic Surgery

## 2015-12-29 ENCOUNTER — Encounter (HOSPITAL_COMMUNITY): Payer: Self-pay | Admitting: *Deleted

## 2015-12-29 DIAGNOSIS — H269 Unspecified cataract: Secondary | ICD-10-CM | POA: Diagnosis present

## 2015-12-29 DIAGNOSIS — Z85048 Personal history of other malignant neoplasm of rectum, rectosigmoid junction, and anus: Secondary | ICD-10-CM

## 2015-12-29 DIAGNOSIS — Z882 Allergy status to sulfonamides status: Secondary | ICD-10-CM | POA: Diagnosis not present

## 2015-12-29 DIAGNOSIS — M19041 Primary osteoarthritis, right hand: Secondary | ICD-10-CM | POA: Diagnosis present

## 2015-12-29 DIAGNOSIS — I110 Hypertensive heart disease with heart failure: Secondary | ICD-10-CM | POA: Diagnosis present

## 2015-12-29 DIAGNOSIS — G629 Polyneuropathy, unspecified: Secondary | ICD-10-CM | POA: Diagnosis present

## 2015-12-29 DIAGNOSIS — Z8249 Family history of ischemic heart disease and other diseases of the circulatory system: Secondary | ICD-10-CM

## 2015-12-29 DIAGNOSIS — Z96619 Presence of unspecified artificial shoulder joint: Secondary | ICD-10-CM

## 2015-12-29 DIAGNOSIS — Z880 Allergy status to penicillin: Secondary | ICD-10-CM | POA: Diagnosis not present

## 2015-12-29 DIAGNOSIS — M170 Bilateral primary osteoarthritis of knee: Secondary | ICD-10-CM | POA: Diagnosis present

## 2015-12-29 DIAGNOSIS — E119 Type 2 diabetes mellitus without complications: Secondary | ICD-10-CM | POA: Diagnosis present

## 2015-12-29 DIAGNOSIS — K219 Gastro-esophageal reflux disease without esophagitis: Secondary | ICD-10-CM | POA: Diagnosis present

## 2015-12-29 DIAGNOSIS — Z96612 Presence of left artificial shoulder joint: Secondary | ICD-10-CM

## 2015-12-29 DIAGNOSIS — M75102 Unspecified rotator cuff tear or rupture of left shoulder, not specified as traumatic: Secondary | ICD-10-CM | POA: Diagnosis present

## 2015-12-29 DIAGNOSIS — M129 Arthropathy, unspecified: Secondary | ICD-10-CM | POA: Diagnosis present

## 2015-12-29 DIAGNOSIS — I509 Heart failure, unspecified: Secondary | ICD-10-CM | POA: Diagnosis present

## 2015-12-29 DIAGNOSIS — Z79899 Other long term (current) drug therapy: Secondary | ICD-10-CM

## 2015-12-29 DIAGNOSIS — M19042 Primary osteoarthritis, left hand: Secondary | ICD-10-CM | POA: Diagnosis present

## 2015-12-29 DIAGNOSIS — I959 Hypotension, unspecified: Secondary | ICD-10-CM | POA: Diagnosis not present

## 2015-12-29 DIAGNOSIS — E785 Hyperlipidemia, unspecified: Secondary | ICD-10-CM | POA: Diagnosis present

## 2015-12-29 DIAGNOSIS — M19012 Primary osteoarthritis, left shoulder: Principal | ICD-10-CM | POA: Diagnosis present

## 2015-12-29 HISTORY — PX: REVERSE SHOULDER ARTHROPLASTY: SHX5054

## 2015-12-29 LAB — GLUCOSE, CAPILLARY: GLUCOSE-CAPILLARY: 153 mg/dL — AB (ref 65–99)

## 2015-12-29 SURGERY — ARTHROPLASTY, SHOULDER, TOTAL, REVERSE
Anesthesia: Regional | Laterality: Left

## 2015-12-29 MED ORDER — ACETAMINOPHEN 500 MG PO TABS
1000.0000 mg | ORAL_TABLET | Freq: Four times a day (QID) | ORAL | Status: AC
  Administered 2015-12-29 – 2015-12-30 (×3): 1000 mg via ORAL
  Filled 2015-12-29 (×3): qty 2

## 2015-12-29 MED ORDER — MEPERIDINE HCL 25 MG/ML IJ SOLN: 6.2500 mg | INTRAMUSCULAR | Status: DC | PRN

## 2015-12-29 MED ORDER — VANCOMYCIN HCL IN DEXTROSE 1-5 GM/200ML-% IV SOLN
INTRAVENOUS | Status: AC
  Filled 2015-12-29: qty 200

## 2015-12-29 MED ORDER — PREGABALIN 75 MG PO CAPS
75.0000 mg | ORAL_CAPSULE | Freq: Two times a day (BID) | ORAL | Status: DC
  Administered 2015-12-29 – 2015-12-30 (×2): 75 mg via ORAL
  Filled 2015-12-29 (×2): qty 1

## 2015-12-29 MED ORDER — PHENYLEPHRINE HCL 10 MG/ML IJ SOLN
INTRAMUSCULAR | Status: DC | PRN
  Administered 2015-12-29 (×2): 120 ug via INTRAVENOUS
  Administered 2015-12-29 (×2): 80 ug via INTRAVENOUS

## 2015-12-29 MED ORDER — PROPOFOL 10 MG/ML IV BOLUS
INTRAVENOUS | Status: DC | PRN
  Administered 2015-12-29 (×2): 50 mg via INTRAVENOUS
  Administered 2015-12-29: 150 mg via INTRAVENOUS
  Administered 2015-12-29: 50 mg via INTRAVENOUS

## 2015-12-29 MED ORDER — POTASSIUM CHLORIDE IN NACL 20-0.45 MEQ/L-% IV SOLN
INTRAVENOUS | Status: DC
  Administered 2015-12-29: 15:00:00 via INTRAVENOUS
  Filled 2015-12-29 (×5): qty 1000

## 2015-12-29 MED ORDER — BISACODYL 5 MG PO TBEC: 5.0000 mg | DELAYED_RELEASE_TABLET | Freq: Every day | ORAL | Status: DC | PRN

## 2015-12-29 MED ORDER — VANCOMYCIN HCL IN DEXTROSE 1-5 GM/200ML-% IV SOLN
1000.0000 mg | INTRAVENOUS | Status: AC
  Administered 2015-12-29: 1000 mg via INTRAVENOUS

## 2015-12-29 MED ORDER — FENTANYL CITRATE (PF) 250 MCG/5ML IJ SOLN
INTRAMUSCULAR | Status: DC | PRN
  Administered 2015-12-29 (×2): 25 ug via INTRAVENOUS
  Administered 2015-12-29: 100 ug via INTRAVENOUS
  Administered 2015-12-29: 50 ug via INTRAVENOUS

## 2015-12-29 MED ORDER — VANCOMYCIN HCL IN DEXTROSE 1-5 GM/200ML-% IV SOLN
1000.0000 mg | Freq: Two times a day (BID) | INTRAVENOUS | Status: AC
  Administered 2015-12-29: 1000 mg via INTRAVENOUS
  Filled 2015-12-29: qty 200

## 2015-12-29 MED ORDER — POLYETHYLENE GLYCOL 3350 17 G PO PACK
17.0000 g | PACK | Freq: Every day | ORAL | Status: DC | PRN
Start: 2015-12-29 — End: 2015-12-30

## 2015-12-29 MED ORDER — ONDANSETRON HCL 4 MG/2ML IJ SOLN
INTRAMUSCULAR | Status: DC | PRN
  Administered 2015-12-29: 4 mg via INTRAVENOUS

## 2015-12-29 MED ORDER — MIDAZOLAM HCL 2 MG/2ML IJ SOLN
INTRAMUSCULAR | Status: DC | PRN
  Administered 2015-12-29: 2 mg via INTRAVENOUS

## 2015-12-29 MED ORDER — PHENOL 1.4 % MT LIQD: 1.0000 | OROMUCOSAL | Status: DC | PRN

## 2015-12-29 MED ORDER — DIPHENHYDRAMINE HCL 12.5 MG/5ML PO ELIX: 12.5000 mg | ORAL_SOLUTION | ORAL | Status: DC | PRN

## 2015-12-29 MED ORDER — SODIUM CHLORIDE 0.9 % IR SOLN
Status: DC | PRN
  Administered 2015-12-29: 1000 mL

## 2015-12-29 MED ORDER — ASPIRIN EC 325 MG PO TBEC
325.0000 mg | DELAYED_RELEASE_TABLET | Freq: Every day | ORAL | Status: DC
  Administered 2015-12-29 – 2015-12-30 (×2): 325 mg via ORAL
  Filled 2015-12-29 (×2): qty 1

## 2015-12-29 MED ORDER — ALUM & MAG HYDROXIDE-SIMETH 200-200-20 MG/5ML PO SUSP: 30.0000 mL | ORAL | Status: DC | PRN

## 2015-12-29 MED ORDER — METOCLOPRAMIDE HCL 5 MG/ML IJ SOLN: 5.0000 mg | Freq: Three times a day (TID) | INTRAMUSCULAR | Status: DC | PRN

## 2015-12-29 MED ORDER — ACETAMINOPHEN 325 MG PO TABS: 650.0000 mg | ORAL_TABLET | Freq: Four times a day (QID) | ORAL | Status: DC | PRN

## 2015-12-29 MED ORDER — ALBUTEROL SULFATE (2.5 MG/3ML) 0.083% IN NEBU: 3.0000 mL | INHALATION_SOLUTION | Freq: Four times a day (QID) | RESPIRATORY_TRACT | Status: DC | PRN

## 2015-12-29 MED ORDER — ARTIFICIAL TEARS OP OINT
TOPICAL_OINTMENT | OPHTHALMIC | Status: DC | PRN
  Administered 2015-12-29: 1 via OPHTHALMIC

## 2015-12-29 MED ORDER — ZOLPIDEM TARTRATE 5 MG PO TABS: 5.0000 mg | ORAL_TABLET | Freq: Every evening | ORAL | Status: DC | PRN

## 2015-12-29 MED ORDER — ARTIFICIAL TEARS OP OINT
TOPICAL_OINTMENT | OPHTHALMIC | Status: AC
  Filled 2015-12-29: qty 3.5

## 2015-12-29 MED ORDER — ONDANSETRON HCL 4 MG/2ML IJ SOLN
INTRAMUSCULAR | Status: AC
  Filled 2015-12-29: qty 2

## 2015-12-29 MED ORDER — MIDAZOLAM HCL 2 MG/2ML IJ SOLN
INTRAMUSCULAR | Status: AC
  Filled 2015-12-29: qty 2

## 2015-12-29 MED ORDER — DEXTROSE 5 % IV SOLN
10.0000 mg | INTRAVENOUS | Status: DC | PRN
  Administered 2015-12-29: 50 ug/min via INTRAVENOUS

## 2015-12-29 MED ORDER — POVIDONE-IODINE 7.5 % EX SOLN
Freq: Once | CUTANEOUS | Status: DC
  Filled 2015-12-29: qty 118

## 2015-12-29 MED ORDER — FENTANYL CITRATE (PF) 250 MCG/5ML IJ SOLN
INTRAMUSCULAR | Status: AC
  Filled 2015-12-29: qty 5

## 2015-12-29 MED ORDER — TRANEXAMIC ACID 1000 MG/10ML IV SOLN
1000.0000 mg | INTRAVENOUS | Status: AC
  Administered 2015-12-29: 1000 mg via INTRAVENOUS
  Filled 2015-12-29: qty 10

## 2015-12-29 MED ORDER — LIDOCAINE 2% (20 MG/ML) 5 ML SYRINGE
INTRAMUSCULAR | Status: AC
  Filled 2015-12-29: qty 5

## 2015-12-29 MED ORDER — ACETAMINOPHEN 650 MG RE SUPP: 650.0000 mg | Freq: Four times a day (QID) | RECTAL | Status: DC | PRN

## 2015-12-29 MED ORDER — PROPOFOL 10 MG/ML IV BOLUS
INTRAVENOUS | Status: AC
  Filled 2015-12-29: qty 20

## 2015-12-29 MED ORDER — ONDANSETRON HCL 4 MG/2ML IJ SOLN: 4.0000 mg | Freq: Four times a day (QID) | INTRAMUSCULAR | Status: DC | PRN

## 2015-12-29 MED ORDER — HYDROMORPHONE HCL 1 MG/ML IJ SOLN: 0.2500 mg | INTRAMUSCULAR | Status: DC | PRN

## 2015-12-29 MED ORDER — SUCCINYLCHOLINE CHLORIDE 200 MG/10ML IV SOSY
PREFILLED_SYRINGE | INTRAVENOUS | Status: AC
  Filled 2015-12-29: qty 10

## 2015-12-29 MED ORDER — ONDANSETRON HCL 4 MG PO TABS: 4.0000 mg | ORAL_TABLET | Freq: Four times a day (QID) | ORAL | Status: DC | PRN

## 2015-12-29 MED ORDER — METOPROLOL TARTRATE 5 MG/5ML IV SOLN
INTRAVENOUS | Status: DC | PRN
  Administered 2015-12-29: 2 mg via INTRAVENOUS

## 2015-12-29 MED ORDER — PROMETHAZINE HCL 25 MG/ML IJ SOLN: 6.2500 mg | INTRAMUSCULAR | Status: DC | PRN

## 2015-12-29 MED ORDER — RAMIPRIL 10 MG PO CAPS
10.0000 mg | ORAL_CAPSULE | Freq: Two times a day (BID) | ORAL | Status: DC
  Administered 2015-12-29 (×2): 10 mg via ORAL
  Filled 2015-12-29 (×2): qty 1

## 2015-12-29 MED ORDER — MENTHOL 3 MG MT LOZG: 1.0000 | LOZENGE | OROMUCOSAL | Status: DC | PRN

## 2015-12-29 MED ORDER — SUCCINYLCHOLINE CHLORIDE 20 MG/ML IJ SOLN
INTRAMUSCULAR | Status: DC | PRN
  Administered 2015-12-29: 100 mg via INTRAVENOUS

## 2015-12-29 MED ORDER — OXYCODONE HCL 5 MG PO TABS
5.0000 mg | ORAL_TABLET | ORAL | Status: DC | PRN
  Administered 2015-12-29: 10 mg via ORAL
  Filled 2015-12-29: qty 2

## 2015-12-29 MED ORDER — GLYCOPYRROLATE 0.2 MG/ML IV SOSY
PREFILLED_SYRINGE | INTRAVENOUS | Status: AC
  Filled 2015-12-29: qty 3

## 2015-12-29 MED ORDER — METOPROLOL TARTRATE 5 MG/5ML IV SOLN
INTRAVENOUS | Status: AC
  Filled 2015-12-29: qty 5

## 2015-12-29 MED ORDER — GLYCOPYRROLATE 0.2 MG/ML IJ SOLN
INTRAMUSCULAR | Status: DC | PRN
  Administered 2015-12-29: 0.1 mg via INTRAVENOUS

## 2015-12-29 MED ORDER — FLUOXETINE HCL 10 MG PO CAPS
10.0000 mg | ORAL_CAPSULE | Freq: Every day | ORAL | Status: DC
  Administered 2015-12-29 – 2015-12-30 (×2): 10 mg via ORAL
  Filled 2015-12-29 (×2): qty 1

## 2015-12-29 MED ORDER — FLEET ENEMA 7-19 GM/118ML RE ENEM: 1.0000 | ENEMA | Freq: Once | RECTAL | Status: DC | PRN

## 2015-12-29 MED ORDER — LACTATED RINGERS IV SOLN
INTRAVENOUS | Status: DC | PRN
  Administered 2015-12-29 (×2): via INTRAVENOUS

## 2015-12-29 MED ORDER — DOCUSATE SODIUM 100 MG PO CAPS
100.0000 mg | ORAL_CAPSULE | Freq: Two times a day (BID) | ORAL | Status: DC
  Administered 2015-12-29 – 2015-12-30 (×3): 100 mg via ORAL
  Filled 2015-12-29 (×3): qty 1

## 2015-12-29 MED ORDER — PHENYLEPHRINE 40 MCG/ML (10ML) SYRINGE FOR IV PUSH (FOR BLOOD PRESSURE SUPPORT)
PREFILLED_SYRINGE | INTRAVENOUS | Status: AC
  Filled 2015-12-29: qty 10

## 2015-12-29 MED ORDER — METOCLOPRAMIDE HCL 5 MG PO TABS: 5.0000 mg | ORAL_TABLET | Freq: Three times a day (TID) | ORAL | Status: DC | PRN

## 2015-12-29 MED ORDER — MORPHINE SULFATE (PF) 2 MG/ML IV SOLN: 1.0000 mg | INTRAVENOUS | Status: DC | PRN

## 2015-12-29 SURGICAL SUPPLY — 69 items
AID PSTN UNV HD RSTRNT DISP (MISCELLANEOUS) ×1
BIT DRILL 5/64X5 DISP (BIT) ×2 IMPLANT
BLADE SAW SAG 73X25 THK (BLADE) ×1
BLADE SAW SGTL 73X25 THK (BLADE) ×1 IMPLANT
BLADE SURG 15 STRL LF DISP TIS (BLADE) ×1 IMPLANT
BLADE SURG 15 STRL SS (BLADE) ×2
BOWL SMART MIX CTS (DISPOSABLE) ×1 IMPLANT
CAPT SHLDR REVTOTAL 2 ALTIVATE ×2 IMPLANT
CHLORAPREP W/TINT 26ML (MISCELLANEOUS) ×2 IMPLANT
COVER MAYO STAND STRL (DRAPES) IMPLANT
COVER SURGICAL LIGHT HANDLE (MISCELLANEOUS) ×2 IMPLANT
DRAPE INCISE IOBAN 66X45 STRL (DRAPES) ×2 IMPLANT
DRAPE ORTHO SPLIT 77X108 STRL (DRAPES) ×4
DRAPE SURG ORHT 6 SPLT 77X108 (DRAPES) ×2 IMPLANT
DRSG AQUACEL AG ADV 3.5X10 (GAUZE/BANDAGES/DRESSINGS) ×1 IMPLANT
ELECT BLADE 4.0 EZ CLEAN MEGAD (MISCELLANEOUS) ×2
ELECT REM PT RETURN 9FT ADLT (ELECTROSURGICAL) ×2
ELECTRODE BLDE 4.0 EZ CLN MEGD (MISCELLANEOUS) ×1 IMPLANT
ELECTRODE REM PT RTRN 9FT ADLT (ELECTROSURGICAL) ×1 IMPLANT
EVACUATOR 1/8 PVC DRAIN (DRAIN) IMPLANT
GLOVE BIO SURGEON STRL SZ7 (GLOVE) ×2 IMPLANT
GLOVE BIO SURGEON STRL SZ7.5 (GLOVE) ×2 IMPLANT
GLOVE BIOGEL PI IND STRL 7.0 (GLOVE) ×1 IMPLANT
GLOVE BIOGEL PI IND STRL 8 (GLOVE) ×1 IMPLANT
GLOVE BIOGEL PI INDICATOR 7.0 (GLOVE) ×1
GLOVE BIOGEL PI INDICATOR 8 (GLOVE) ×1
GOWN STRL REUS W/ TWL LRG LVL3 (GOWN DISPOSABLE) ×3 IMPLANT
GOWN STRL REUS W/ TWL XL LVL3 (GOWN DISPOSABLE) ×1 IMPLANT
GOWN STRL REUS W/TWL LRG LVL3 (GOWN DISPOSABLE) ×6
GOWN STRL REUS W/TWL XL LVL3 (GOWN DISPOSABLE) ×2
HANDPIECE INTERPULSE COAX TIP (DISPOSABLE) ×2
HOOD PEEL AWAY FLYTE STAYCOOL (MISCELLANEOUS) ×4 IMPLANT
KIT BASIN OR (CUSTOM PROCEDURE TRAY) ×2 IMPLANT
KIT ROOM TURNOVER OR (KITS) ×2 IMPLANT
MANIFOLD NEPTUNE II (INSTRUMENTS) ×2 IMPLANT
NDL HYPO 25GX1X1/2 BEV (NEEDLE) IMPLANT
NDL MAYO TROCAR (NEEDLE) IMPLANT
NEEDLE HYPO 25GX1X1/2 BEV (NEEDLE) IMPLANT
NEEDLE MAYO TROCAR (NEEDLE) IMPLANT
NOZZLE PRISM 8.5MM (MISCELLANEOUS) IMPLANT
NS IRRIG 1000ML POUR BTL (IV SOLUTION) ×2 IMPLANT
PACK SHOULDER (CUSTOM PROCEDURE TRAY) ×2 IMPLANT
PAD ARMBOARD 7.5X6 YLW CONV (MISCELLANEOUS) ×4 IMPLANT
RESTRAINT HEAD UNIVERSAL NS (MISCELLANEOUS) ×2 IMPLANT
RETRIEVER SUT HEWSON (MISCELLANEOUS) ×2 IMPLANT
SET HNDPC FAN SPRY TIP SCT (DISPOSABLE) ×1 IMPLANT
SLING ARM IMMOBILIZER LRG (SOFTGOODS) ×2 IMPLANT
SLING ARM IMMOBILIZER MED (SOFTGOODS) IMPLANT
SPONGE LAP 18X18 X RAY DECT (DISPOSABLE) ×2 IMPLANT
SPONGE LAP 4X18 X RAY DECT (DISPOSABLE) IMPLANT
STRIP CLOSURE SKIN 1/2X4 (GAUZE/BANDAGES/DRESSINGS) ×2 IMPLANT
SUCTION FRAZIER HANDLE 10FR (MISCELLANEOUS) ×1
SUCTION TUBE FRAZIER 10FR DISP (MISCELLANEOUS) ×1 IMPLANT
SUPPORT WRAP ARM LG (MISCELLANEOUS) ×2 IMPLANT
SUT ETHIBOND NAB CT1 #1 30IN (SUTURE) ×6 IMPLANT
SUT MNCRL AB 4-0 PS2 18 (SUTURE) ×2 IMPLANT
SUT SILK 2 0 TIES 17X18 (SUTURE)
SUT SILK 2-0 18XBRD TIE BLK (SUTURE) IMPLANT
SUT VIC AB 0 CTB1 27 (SUTURE) ×3 IMPLANT
SUT VIC AB 2-0 CT1 27 (SUTURE) ×4
SUT VIC AB 2-0 CT1 TAPERPNT 27 (SUTURE) ×1 IMPLANT
SYR CONTROL 10ML LL (SYRINGE) ×1 IMPLANT
SYRINGE TOOMEY DISP (SYRINGE) ×1 IMPLANT
SYS SHLDR CAPITATED REV 2 IMPLANT
TAPE FIBER 2MM 7IN #2 BLUE (SUTURE) ×1 IMPLANT
TOWEL OR 17X24 6PK STRL BLUE (TOWEL DISPOSABLE) ×2 IMPLANT
TOWEL OR 17X26 10 PK STRL BLUE (TOWEL DISPOSABLE) ×2 IMPLANT
WATER STERILE IRR 1000ML POUR (IV SOLUTION) ×1 IMPLANT
YANKAUER SUCT BULB TIP NO VENT (SUCTIONS) IMPLANT

## 2015-12-29 NOTE — Discharge Instructions (Signed)
Discharge Instructions after Reverse Total Shoulder Arthroplasty ° ° °A sling has been provided for you. You are to where this at all times, even while sleeping, until your first post operative visit with Dr. Chandler. °Use ice on the shoulder intermittently over the first 48 hours after surgery.  °Pain medicine has been prescribed for you.  °Use your medicine liberally over the first 48 hours, and then you can begin to taper your use. You [place] take Extra Strength Tylenol or Tylenol only in place of the pain pills. DO NOT take ANY nonsteroidal anti-inflammatory pain medications: Advil, Motrin, Ibuprofen, Aleve, Naproxen or Naprosyn.  °Take one aspirin a day for 2 weeks after surgery, unless you have an aspirin sensitivity/allergy or asthma.  °Leave your dressing on until your first follow up visit.  You [place] shower with the dressing.  Hold your arm as if you still have your sling on while you shower. °Simply allow the water to wash over the site and then pat dry. Make sure your axilla (armpit) is completely dry after showering. ° ° ° °Please call [phone] during normal business hours or [phone] after hours for any problems. Including the following: ° °- excessive redness of the incisions °- drainage for more than 4 days °- fever of more than 101.5 F ° °*Please note that pain medications will not be refilled after hours or on weekends. ° ° ° ° °

## 2015-12-29 NOTE — Op Note (Signed)
Procedure(s): REVERSE SHOULDER ARTHROPLASTY Procedure Note  Lori Martinez female 65 y.o. 12/29/2015  Procedure(s) and Anesthesia Type:    * REVERSE SHOULDER ARTHROPLASTY - Choice   Indications:  65 y.o. female  With left shoulder arthritis with irrepairable rotator cuff tear. Pain and dysfunction interfered with quality of life and nonoperative treatment with activity modification, NSAIDS and injections failed.     Surgeon: Nita Sells   Assistants: Jeanmarie Hubert PA-C [facility] was present and scrubbed throughout the procedure and was essential in positioning, retraction, exposure, and closure)  Anesthesia: General endotracheal anesthesia with preoperative interscalene block given by the attending anesthesiologist    Procedure Detail  REVERSE SHOULDER ARTHROPLASTY   Estimated Blood Loss:  200 mL         Drains: none  Blood Given: none          Specimens: none        Complications:  * No complications entered in OR log *         Disposition: PACU - hemodynamically stable.         Condition: stable      OPERATIVE FINDINGS:  A DJO Altivate pressfit reverse total shoulder arthroplasty was placed with a  size 6 stem, a 32-4 glenosphere, and a standard poly insert. The base plate  fixation was excellent.  PROCEDURE: The patient was identified in the preoperative holding area  where I personally marked the operative site after verifying site, side,  and procedure with the patient. An interscalene block given by  the attending anesthesiologist in the holding area and the patient was taken back to the operating room where all extremities were  carefully padded in position after general anesthesia was induced. She  was placed in a beach-chair position and the operative upper extremity was  prepped and draped in a standard sterile fashion. An approximately 10-  cm incision was made from the tip of the coracoid process to the center  point of the humerus at  the level of the axilla. Dissection was carried  down through subcutaneous tissues to the level of the cephalic vein  which was taken laterally with the deltoid. The pectoralis major was  retracted medially. The subdeltoid space was developed and the lateral  edge of the conjoined tendon was identified. The undersurface of  conjoined tendon was palpated and the musculocutaneous nerve was not in  the field. Retractor was placed underneath the conjoined and second  retractor was placed lateral into the deltoid. The circumflex humeral  artery and vessels were identified and clamped and coagulated. The  biceps tendon was very attenuated and tenotomized.  The subscapularis was taken down as a peel.  The  joint was then gently externally rotated while the capsule was released  from the humeral neck around to just beyond the 6 o'clock position. At  this point, the joint was dislocated and the humeral head was presented  into the wound. The excessive osteophyte formation was removed with a  large rongeur.  The cutting guide was used to make the appropriate  head cut and the head was saved for potentially bone grafting.  The glenoid was exposed with the arm in an  abducted extended position. The anterior and posterior labrum were  completely excised and the capsule was released circumferentially to  allow for exposure of the glenoid for preparation. The 2.5 mm drill was  placed using the guide in 5-10 inferior angulation and the tap was then advanced in the same hole. Small  and large reamers were then used. The tap was then removed and the Metaglene was then screwed in with excellent purchase.  The peripheral guide was then used to drilled measured and filled peripheral locking screws. The size  32-4  glenosphere was then impacted on the [facility] taper and the central screw was placed. The humerus was then again exposed and the diaphyseal reamers were used followed by the metaphyseal reamers. The final  broach was left in place in the proximal trial was placed. The joint was reduced and with this implant it was felt that soft tissue tensioning was appropriate with excellent stability and excellent range of motion. Therefore, final humeral stem was placed with bone grafting from the head cut with excellent press-fit.  And then the trial polyethylene inserts were tested again and the above implant was felt to be the most appropriate for final insertion. The joint was reduced taken through full range of motion and felt to be stable. Soft tissue tension was appropriate.  The joint was then copiously irrigated with pulse  lavage and the wound was then closed. The subscapularis was not repaired.  Skin was closed with 2-0 Vicryl in a deep dermal layer and 4-0  Monocryl for skin closure. Steri-Strips were applied. Sterile  dressings were then applied as well as a sling. The patient was allowed  to awaken from general anesthesia, transferred to stretcher, and taken  to recovery room in stable condition.   POSTOPERATIVE PLAN: The patient will be kept in the hospital postoperatively  for pain control and therapy.

## 2015-12-29 NOTE — Transfer of Care (Signed)
Immediate Anesthesia Transfer of Care Note  Patient: Lori Martinez  Procedure(s) Performed: Procedure(s) with comments: REVERSE SHOULDER ARTHROPLASTY (Left) - Left reverse total shoulder  Patient Location: PACU  Anesthesia Type:General and Regional  Level of Consciousness: awake, alert , oriented and patient cooperative  Airway & Oxygen Therapy: Patient Spontanous Breathing and Patient connected to face mask oxygen  Post-op Assessment: Report given to RN, Post -op Vital signs reviewed and stable, Patient moving all extremities X 4 and Patient able to stick tongue midline  Post vital signs: Reviewed and stable  Last Vitals: There were no vitals filed for this visit.  Last Pain: There were no vitals filed for this visit.       Complications: No apparent anesthesia complications

## 2015-12-29 NOTE — Anesthesia Postprocedure Evaluation (Signed)
Anesthesia Post Note  Patient: Lori Martinez  Procedure(s) Performed: Procedure(s) (LRB): REVERSE SHOULDER ARTHROPLASTY (Left)  Patient location during evaluation: PACU Anesthesia Type: General and Regional Level of consciousness: sedated and patient cooperative Pain management: pain level controlled Vital Signs Assessment: post-procedure vital signs reviewed and stable Respiratory status: spontaneous breathing Cardiovascular status: stable Anesthetic complications: no    Last Vitals:  Filed Vitals:   12/29/15 1246 12/29/15 1259  BP:  120/70  Pulse:  76  Temp: 36.8 C 36.8 C  Resp:  18    Last Pain: There were no vitals filed for this visit.               Nolon Nations

## 2015-12-29 NOTE — H&P (Signed)
Lori Martinez is an 65 y.o. female.   Chief Complaint:  L shoulder pain and dysfunction. HPI: L shoulder irrepairable rotator cuff tear with mild arthropathy, failed extensive conserative management.  Pain and dysfunction interfere with sleep and quality of life.  Past Medical History  Diagnosis Date  . Cancer ([place])   . Hyperlipidemia   . Hypertension   . Acid reflux   . Cataract   . Diabetes mellitus   . CHF (congestive heart failure) ([place])   . Arthritis     hands and knees  . Trouble swallowing   . Hearing loss   . Change in voice   . Chronic cough   . Chest pain     seen 2010 (Dr. Elisabeth Cara, [facility]) and had low risk stress test, EF 76%.  . Vomiting     as result of acid reflux   . Headaches, cluster   . Neuropathy, peripheral ([place])   . Frequent urination   . Wears glasses   . Rotator cuff tear arthropathy     left  . Pneumonia     as a teenager    Past Surgical History  Procedure Laterality Date  . Cholecystectomy    . Eye surgery    . Back surgery  2010  . Colon surgery  1990    colon/rectal cancer  . Hernia repair      hiatal hernia   . Laparoscopic nissen fundoplication  [id number]  . Abdominal hysterectomy      Family History  Problem Relation Age of Onset  . Stroke Mother   . Heart disease Father   . Heart disease Brother    Social History:  reports that she has never smoked. She has never used smokeless tobacco. She reports that she does not drink alcohol or use illicit drugs.  Allergies:  Allergies  Allergen Reactions  . Penicillins Rash and Other (See Comments)    Dry mouth and hives Has patient had a PCN reaction causing immediate rash, facial/tongue/throat swelling, SOB or lightheadedness with hypotension: Yes Has patient had a PCN reaction causing severe rash involving mucus membranes or skin necrosis: No Has patient had a PCN reaction that required hospitalization No Has patient had a PCN reaction occurring within the last 10 years: No If all of  the above answers are "NO  . Sulfonamide Derivatives Hives and Other (See Comments)    Dry mouth and hives    Medications Prior to Admission  Medication Sig Dispense Refill  . CRANBERRY PO Take 1 tablet by mouth daily.    . cyclobenzaprine (FLEXERIL) 10 MG tablet Take 1 tablet (10 mg total) by mouth 3 (three) times daily as needed for muscle spasms. (Patient taking differently: Take 10 mg by mouth 3 (three) times daily. ) 60 tablet 0  . FLUoxetine (PROZAC) 10 MG capsule Take 10 mg by mouth daily.    Marland Kitchen ibuprofen (ADVIL,MOTRIN) 200 MG tablet Take 400 mg by mouth every 6 (six) hours as needed (pain).     . Multiple Vitamin (MULTIVITAMIN WITH MINERALS) TABS tablet Take 1 tablet by mouth daily. Centrum Silver    . pregabalin (LYRICA) 75 MG capsule Take 75 mg by mouth 2 (two) times daily.    . ramipril (ALTACE) 10 MG tablet Take 10 mg by mouth 2 (two) times daily.     Marland Kitchen albuterol (PROVENTIL HFA;VENTOLIN HFA) 108 (90 BASE) MCG/ACT inhaler Inhale 1-2 puffs into the lungs every 6 (six) hours as needed for wheezing or shortness of  breath.      No results found for this or any previous visit (from the past 48 hour(s)). No results found.  Review of Systems  All other systems reviewed and are negative.   Height 5\' 3"  (1.6 m), weight 83.915 kg (185 lb). Physical Exam  Constitutional: She is oriented to person, place, and time. She appears well-developed and well-nourished.  HENT:  Head: Atraumatic.  Eyes: EOM are normal.  Cardiovascular: Intact distal pulses.   Respiratory: Effort normal.  Musculoskeletal:  L shoulder pain weakness with RC testing. NVID.  Neurological: She is alert and oriented to person, place, and time.  Skin: Skin is warm and dry.  Psychiatric: She has a normal mood and affect.     Assessment/Plan L shoulder irrepairable rotator cuff tear with mild arthropathy, failed extensive conserative management. Plan L reverse TSA Risks / benefits of surgery discussed Consent on  chart  NPO for OR Preop antibiotics   Nita Sells, MD 12/29/2015, 7:20 AM

## 2015-12-29 NOTE — Anesthesia Procedure Notes (Addendum)
Procedure Name: Intubation Date/Time: 12/29/2015 7:52 AM Performed by: Collier Bullock Pre-anesthesia Checklist: Patient identified, Emergency Drugs available, Suction available and Patient being monitored Patient Re-evaluated:Patient Re-evaluated prior to inductionOxygen Delivery Method: Circle system utilized Preoxygenation: Pre-oxygenation with 100% oxygen Intubation Type: IV induction Laryngoscope Size: Mac and 3 Grade View: Grade I Tube type: Oral Tube size: 7.0 mm Number of attempts: 1 Airway Equipment and Method: Stylet Placement Confirmation: ETT inserted through vocal cords under direct vision,  positive ETCO2 and breath sounds checked- equal and bilateral Secured at: 22 cm Dental Injury: Teeth and Oropharynx as per pre-operative assessment    Anesthesia Regional Block:  Interscalene brachial plexus block  Pre-Anesthetic Checklist: ,, timeout performed, Correct Patient, Correct Site, Correct Laterality, Correct Procedure, Correct Position, site marked, Risks and benefits discussed, Surgical consent,  Pre-op evaluation,  Post-op pain management  Laterality: Left  Prep: chloraprep       Needles:  Injection technique: Single-shot  Needle Type: Stimulator Needle - 40     Needle Length: 4cm 4 cm Needle Gauge: 22 and 22 G    Additional Needles:  Procedures: nerve stimulator Interscalene brachial plexus block Narrative:  Injection made incrementally with aspirations every 5 mL.  Performed by: Personally  Anesthesiologist: Nolon Nations  Additional Notes: BP cuff, EKG monitors applied. Sedation begun. Nerve location verified with U/S. Anesthetic injected incrementally, slowly , and after neg aspirations under direct u/s guidance. Good perineural spread. Tolerated well.

## 2015-12-30 ENCOUNTER — Encounter (HOSPITAL_COMMUNITY): Payer: Self-pay | Admitting: Orthopedic Surgery

## 2015-12-30 LAB — CBC
HCT: 35.8 % — ABNORMAL LOW (ref 36.0–46.0)
Hemoglobin: 11.8 g/dL — ABNORMAL LOW (ref 12.0–15.0)
MCH: 28.8 pg (ref 26.0–34.0)
MCHC: 33 g/dL (ref 30.0–36.0)
MCV: 87.3 fL (ref 78.0–100.0)
PLATELETS: 199 10*3/uL (ref 150–400)
RBC: 4.1 MIL/uL (ref 3.87–5.11)
RDW: 14.7 % (ref 11.5–15.5)
WBC: 9.4 10*3/uL (ref 4.0–10.5)

## 2015-12-30 LAB — BASIC METABOLIC PANEL
ANION GAP: 6 (ref 5–15)
BUN: 14 mg/dL (ref 6–20)
CALCIUM: 8.4 mg/dL — AB (ref 8.9–10.3)
CO2: 24 mmol/L (ref 22–32)
CREATININE: 0.83 mg/dL (ref 0.44–1.00)
Chloride: 105 mmol/L (ref 101–111)
GFR calc Af Amer: >60 mL/min (ref >60)
GLUCOSE: 119 mg/dL — AB (ref 65–99)
Potassium: 3.9 mmol/L (ref 3.5–5.1)
Sodium: 135 mmol/L (ref 135–145)

## 2015-12-30 MED ORDER — DOCUSATE SODIUM 100 MG PO CAPS: 100.0000 mg | ORAL_CAPSULE | Freq: Three times a day (TID) | ORAL | Status: DC | PRN

## 2015-12-30 MED ORDER — OXYCODONE-ACETAMINOPHEN 5-325 MG PO TABS: 1.0000 | ORAL_TABLET | ORAL | Status: DC | PRN

## 2015-12-30 MED ORDER — SODIUM CHLORIDE 0.9 % IV BOLUS (SEPSIS)
500.0000 mL | Freq: Once | INTRAVENOUS | Status: AC
  Administered 2015-12-30: 500 mL via INTRAVENOUS

## 2015-12-30 NOTE — Progress Notes (Signed)
Pt discharge education and instructions completed with pt; pt voices understanding and denies any questions. Pt IV removed; pt discharge home with family to transport her home. Pt incision dsg remains clean, dry and intact and sling on. Pt handed her prescriptions for colace and roxicet. Pt transported off unit via wheelchair with belongings to the side. Delia Heady RN

## 2015-12-30 NOTE — Evaluation (Signed)
Occupational Therapy Evaluation and Discharge Patient Details Name: Lori Martinez MRN: [id number] DOB: 08-Jun-1951 Today's Date: 12/30/2015    History of Present Illness s/p L reverse TSA PMH: back surgeries   Clinical Impression   Pt educated in sling use and positioning L UE in bed and chair, compensatory strategies for ADL, NWB status of L UE and elbow to hand exercises. Pt unsteady with ambulation from previous back issues. Will benefit from a cane as she was reaching for objects as she ambulated. Pt demonstrated understanding of all education. No further OT needs.    Follow Up Recommendations  No OT follow up    Equipment Recommendations   (cane)    Recommendations for Other Services       Precautions / Restrictions Precautions Precautions: Shoulder Type of Shoulder Precautions: conservative protocol Shoulder Interventions: Shoulder sling/immobilizer;Off for dressing/bathing/exercises Precaution Booklet Issued: Yes (comment) Required Braces or Orthoses: Sling Restrictions Weight Bearing Restrictions: Yes LUE Weight Bearing: Non weight bearing      Mobility Bed Mobility Overal bed mobility: Modified Independent             General bed mobility comments: up to R side, HOB elevated  Transfers Overall transfer level: Modified independent Equipment used: 1 person hand held assist             General transfer comment: pt reaching for objects as she walked    Balance                                            ADL Overall ADL's : Needs assistance/impaired Eating/Feeding: Independent;Bed level   Grooming: Wash/dry hands;Standing;Modified independent   Upper Body Bathing: Modified independent;Sitting   Lower Body Bathing: Modified independent;Sit to/from stand   Upper Body Dressing : Minimal assistance;Sitting;Cueing for compensatory techniques   Lower Body Dressing: Modified independent;Sit to/from stand   Toilet Transfer: Minimal  assistance;Ambulation;Comfort height toilet Toilet Transfer Details (indicate cue type and reason): hand held assist, pt reaching for furniture [place] and Hygiene: Modified independent;Sit to/from stand       Functional mobility during ADLs: Minimal assistance (hand held assist) General ADL Comments: Pt stating she would use her walker at home. Reminded pt that she was NWB on R UE, pt agreeable to cane. CM notified. Educated pt in positioning L UE in bed and chair, sling use, NWB precautions and compensatory strategies for ADL. Reinforced with handout.     Vision     Perception     Praxis      Pertinent Vitals/Pain Pain Assessment: 0-10 Pain Score: 3  Pain Location: L shoulder Pain Descriptors / Indicators: Guarding Pain Intervention(s): Repositioned;Ice applied     Hand Dominance Right   Extremity/Trunk Assessment Upper Extremity Assessment Upper Extremity Assessment: LUE deficits/detail LUE Deficits / Details: performed AROM of elbow, forearm, wrist and hand x 10 LUE: Unable to fully assess due to immobilization LUE Coordination: decreased gross motor   Lower Extremity Assessment Lower Extremity Assessment: Generalized weakness   Cervical / Trunk Assessment Cervical / Trunk Assessment: Other exceptions (hx of multiple back surgeries)   Communication Communication Communication: No difficulties   Cognition Arousal/Alertness: Awake/alert Behavior During Therapy: WFL for tasks assessed/performed Overall Cognitive Status: Within Functional Limits for tasks assessed                     General Comments  Exercises       Shoulder Instructions      Home Living Family/patient expects to be discharged to:: Private residence Living Arrangements: Spouse/significant other Available Help at Discharge: Family;Available PRN/intermittently                         Home Equipment: Walker - 2 wheels;Bedside commode           Prior Functioning/Environment Level of Independence: Independent             OT Diagnosis: Generalized weakness;Acute pain   OT Problem List:     OT Treatment/Interventions:      OT Goals(Current goals can be found in the care plan section) Acute Rehab OT Goals Patient Stated Goal: return to playing Legos with grandson  OT Frequency:     Barriers to D/C:            Co-evaluation              End of Session Nurse Communication: Mobility status  Activity Tolerance: Patient tolerated treatment well Patient left: in chair;with call bell/phone within reach   Time: [id number] OT Time Calculation (min): 51 min Charges:  OT General Charges $OT Visit: 1 Procedure OT Evaluation $OT Eval Moderate Complexity: 1 Procedure OT Treatments $Self Care/Home Management : 8-22 mins $Therapeutic Exercise: 8-22 mins G-Codes:    Lori Martinez, Lori Martinez 12/30/2015, 10:54 AM [phone]

## 2015-12-30 NOTE — Discharge Summary (Signed)
Patient ID: Lori Martinez MRN: [id number] DOB/AGE: [birth year] 65 y.o.  Admit date: 12/29/2015 Discharge date: 12/30/2015  Admission Diagnoses:  Active Problems:   S/p reverse total shoulder arthroplasty   Discharge Diagnoses:  Same  Past Medical History  Diagnosis Date  . Cancer ([place])   . Hyperlipidemia   . Hypertension   . Acid reflux   . Cataract   . Diabetes mellitus   . CHF (congestive heart failure) ([place])   . Arthritis     hands and knees  . Trouble swallowing   . Hearing loss   . Change in voice   . Chronic cough   . Chest pain     seen 2010 (Dr. Elisabeth Cara, [facility]) and had low risk stress test, EF 76%.  . Vomiting     as result of acid reflux   . Headaches, cluster   . Neuropathy, peripheral ([place])   . Frequent urination   . Wears glasses   . Rotator cuff tear arthropathy     left  . Pneumonia     as a teenager    Surgeries: Procedure(s): REVERSE SHOULDER ARTHROPLASTY on 12/29/2015   Consultants:    Discharged Condition: Improved  Hospital Course: Lori Martinez is an 65 y.o. female who was admitted 12/29/2015 for operative treatment of left shoulder rotator cuff tear arthropathy. Patient has severe unremitting pain that affects sleep, daily activities, and work/hobbies. After pre-op clearance the patient was taken to the operating room on 12/29/2015 and underwent  Procedure(s): [place].    Patient was given perioperative antibiotics: Anti-infectives    Start     Dose/Rate Route Frequency Ordered Stop   12/29/15 1930  vancomycin (VANCOCIN) IVPB 1000 mg/200 mL premix     1,000 mg 200 mL/hr over 60 Minutes Intravenous Every 12 hours 12/29/15 1337 12/29/15 2001   12/29/15 0629  vancomycin (VANCOCIN) 1-5 GM/200ML-% IVPB    Comments:  Starleen Arms   : cabinet override      12/29/15 0629 12/29/15 1844   12/29/15 0627  vancomycin (VANCOCIN) IVPB 1000 mg/200 mL premix     1,000 mg 200 mL/hr over 60 Minutes Intravenous On call to O.R. 12/29/15  [id number] 12/29/15 0739       Patient was given sequential compression devices, early ambulation, and ASA daily to prevent DVT.  Patient benefited maximally from hospital stay and there were no complications.    Recent vital signs: Patient Vitals for the past 24 hrs:  BP Temp Temp src Pulse Resp SpO2  12/30/15 0507 (!) 99/50 mmHg 97.7 F (36.5 C) Oral 61 18 94 %  12/29/15 2121 (!) 98/56 mmHg - - 76 - -  12/29/15 2033 (!) 76/33 mmHg 99.6 F (37.6 C) Oral 74 18 100 %  12/29/15 1259 120/70 mmHg 98.2 F (36.8 C) - 76 18 100 %  12/29/15 1246 - 98.2 F (36.8 C) - - - -  12/29/15 1220 114/63 mmHg - - 60 18 100 %  12/29/15 1120 (!) 124/58 mmHg - - (!) 56 15 100 %  12/29/15 1105 124/68 mmHg - - 63 (!) 21 100 %  12/29/15 1100 - 97.9 F (36.6 C) - 62 19 100 %  12/29/15 1050 112/72 mmHg - - (!) 59 17 100 %  12/29/15 1045 - - - 62 (!) 24 100 %  12/29/15 1035 114/61 mmHg - - (!) 59 17 100 %  12/29/15 1030 - - - 65 (!) 21 100 %  12/29/15 1020 118/61 mmHg - -  67 (!) 24 100 %  12/29/15 1015 - - - 69 (!) 24 100 %  12/29/15 1005 116/60 mmHg - - 69 18 100 %  12/29/15 1000 - - - 70 18 99 %  12/29/15 0950 110/63 mmHg - - 72 13 96 %  12/29/15 0945 - - - 75 20 94 %  12/29/15 0936 (!) 118/41 mmHg - - 81 (!) 25 100 %  12/29/15 0935 - 97.7 F (36.5 C) - - - -     Recent laboratory studies:  Recent Labs  12/30/15 0348  WBC 9.4  HGB 11.8*  HCT 35.8*  PLT 199  NA 135  K 3.9  CL 105  CO2 24  BUN 14  CREATININE 0.83  GLUCOSE 119*  CALCIUM 8.4*     Discharge Medications:     Medication List    STOP taking these medications        ibuprofen 200 MG tablet  Commonly known as:  ADVIL,MOTRIN      TAKE these medications        albuterol 108 (90 Base) MCG/ACT inhaler  Commonly known as:  PROVENTIL HFA;VENTOLIN HFA  Inhale 1-2 puffs into the lungs every 6 (six) hours as needed for wheezing or shortness of breath.     CRANBERRY PO  Take 1 tablet by mouth daily.     cyclobenzaprine 10 MG  tablet  Commonly known as:  FLEXERIL  Take 1 tablet (10 mg total) by mouth 3 (three) times daily as needed for muscle spasms.     docusate sodium 100 MG capsule  Commonly known as:  COLACE  Take 1 capsule (100 mg total) by mouth 3 (three) times daily as needed.     FLUoxetine 10 MG capsule  Commonly known as:  PROZAC  Take 10 mg by mouth daily.     multivitamin with minerals Tabs tablet  Take 1 tablet by mouth daily. Centrum Silver     oxyCODONE-acetaminophen 5-325 MG tablet  Commonly known as:  ROXICET  Take 1-2 tablets by mouth every 4 (four) hours as needed for severe pain.     pregabalin 75 MG capsule  Commonly known as:  LYRICA  Take 75 mg by mouth 2 (two) times daily.     ramipril 10 MG tablet  Commonly known as:  ALTACE  Take 10 mg by mouth 2 (two) times daily.        Diagnostic Studies: Dg Chest 2 View  12/22/2015  CLINICAL DATA:  Preoperative evaluation for shoulder replacement surgery, history CHF, diabetes mellitus, hypertension EXAM: CHEST  2 VIEW COMPARISON:  04/14/2014 FINDINGS: Upper normal size of cardiac silhouette. Mediastinal contours and pulmonary vascularity normal. Bronchitic changes without pulmonary infiltrate, pleural effusion or pneumothorax. Scattered degenerative disc disease changes thoracic spine with evidence of prior lumbar fusion. Osseous demineralization. IMPRESSION: Bronchitic changes without infiltrate. Electronically Signed   By: Lavonia Dana M.D.   On: 12/22/2015 11:26   Dg Shoulder Left Port  12/29/2015  CLINICAL DATA:  Post reverse left total shoulder arthroplasty EXAM: LEFT SHOULDER - 1 VIEW COMPARISON:  Left shoulder films of 07/08/2014 FINDINGS: A portable view of the left shoulder shows the reverse left total shoulder arthroplasty components in good position. No complicating features are seen. IMPRESSION: Reverse left total shoulder arthroplasty in good position. Electronically Signed   By: Ivar Drape M.D.   On: 12/29/2015 11:56     Disposition: 06-Home-Health Care Svc      Discharge Instructions    Call MD /  Call 911    Complete by:  As directed   If you experience chest pain or shortness of breath, CALL 911 and be transported to the hospital emergency room.  If you develope a fever above 101 F, pus (white drainage) or increased drainage or redness at the wound, or calf pain, call your surgeon's office.     Constipation Prevention    Complete by:  As directed   Drink plenty of fluids.  Prune juice [place] be helpful.  You B[place]ard use a stool softener, such as Colace (over the counter) 100 mg twice a day.  Use MiraLax (over the counter) for constipation as needed.     Diet - low sodium heart healthy    Complete by:  As directed      Increase activity slowly as tolerated    Complete by:  As directed            Follow-up Information    Follow up with Nita Sells, MD. Schedule an appointment as soon as possible for a visit in 2 weeks.   Specialty:  Orthopedic Surgery   Contact information:   [place]  [id number] [phone]        Signed: Grier Mitts 12/30/2015, 7:56 AM

## 2015-12-30 NOTE — Progress Notes (Signed)
   PATIENT ID: Lori Martinez   1 Day Post-Op Procedure(s) (LRB): REVERSE SHOULDER ARTHROPLASTY (Left)  Subjective: Doing well this am. Sitting up. Comfortable. Block wore off last night, pain controlled.   Objective:  Filed Vitals:   12/29/15 2121 12/30/15 0507  BP: 98/56 99/50  Pulse: 76 61  Temp:  97.7 F (36.5 C)  Resp:  18     L UE dressing c/d/i Sling repositioned Wiggles fingers, distally NVI  Labs:   Recent Labs  12/30/15 0348  HGB 11.8*   Recent Labs  12/30/15 0348  WBC 9.4  RBC 4.10  HCT 35.8*  PLT 199   Recent Labs  12/30/15 0348  NA 135  K 3.9  CL 105  CO2 24  BUN 14  CREATININE 0.83  GLUCOSE 119*  CALCIUM 8.4*    Assessment and Plan: 1 day s/p left reverse TSA Hand wrist elbow ROM only Hypotensive- will bolus 500cc NS Percocet for home pain control D/c today after BP improves and asymptomatic Fu with Dr. Tamera Punt in 2 weeks  VTE proph: ASA 325mg  daily, SCDs

## 2015-12-30 NOTE — Progress Notes (Signed)
Shift notes: patient is alert, awake at present time, no acute distress noted, medicated with schedule Tylenol for pain with full relief, Hydrogel to left shoulder clean, dry and intact, no drainage noted, stated she has more sensation in the left hand and arm, ambulating to bathroom with assistance. Will continue to monitor.

## 2016-06-14 DIAGNOSIS — C210 Malignant neoplasm of anus, unspecified: Secondary | ICD-10-CM | POA: Diagnosis not present

## 2016-06-14 DIAGNOSIS — Z923 Personal history of irradiation: Secondary | ICD-10-CM | POA: Diagnosis not present

## 2016-06-14 DIAGNOSIS — Z9889 Other specified postprocedural states: Secondary | ICD-10-CM | POA: Diagnosis not present

## 2016-06-14 DIAGNOSIS — C189 Malignant neoplasm of colon, unspecified: Secondary | ICD-10-CM | POA: Diagnosis not present

## 2016-06-14 DIAGNOSIS — Z9221 Personal history of antineoplastic chemotherapy: Secondary | ICD-10-CM | POA: Diagnosis not present

## 2016-06-14 DIAGNOSIS — K219 Gastro-esophageal reflux disease without esophagitis: Secondary | ICD-10-CM | POA: Diagnosis not present

## 2016-06-14 DIAGNOSIS — Z880 Allergy status to penicillin: Secondary | ICD-10-CM | POA: Diagnosis not present

## 2016-06-14 DIAGNOSIS — Z08 Encounter for follow-up examination after completed treatment for malignant neoplasm: Secondary | ICD-10-CM | POA: Diagnosis not present

## 2016-06-14 DIAGNOSIS — E1140 Type 2 diabetes mellitus with diabetic neuropathy, unspecified: Secondary | ICD-10-CM | POA: Diagnosis not present

## 2016-06-14 DIAGNOSIS — Z85038 Personal history of other malignant neoplasm of large intestine: Secondary | ICD-10-CM | POA: Diagnosis not present

## 2016-06-14 DIAGNOSIS — Z9049 Acquired absence of other specified parts of digestive tract: Secondary | ICD-10-CM | POA: Diagnosis not present

## 2016-06-14 DIAGNOSIS — Z882 Allergy status to sulfonamides status: Secondary | ICD-10-CM | POA: Diagnosis not present

## 2016-08-09 DIAGNOSIS — Z1211 Encounter for screening for malignant neoplasm of colon: Secondary | ICD-10-CM | POA: Diagnosis not present

## 2016-08-09 DIAGNOSIS — Z85038 Personal history of other malignant neoplasm of large intestine: Secondary | ICD-10-CM | POA: Diagnosis not present

## 2016-08-09 DIAGNOSIS — R194 Change in bowel habit: Secondary | ICD-10-CM | POA: Diagnosis not present

## 2016-08-09 DIAGNOSIS — R159 Full incontinence of feces: Secondary | ICD-10-CM | POA: Diagnosis not present

## 2016-08-09 DIAGNOSIS — Z86010 Personal history of colonic polyps: Secondary | ICD-10-CM | POA: Diagnosis not present

## 2016-08-27 DIAGNOSIS — E119 Type 2 diabetes mellitus without complications: Secondary | ICD-10-CM | POA: Diagnosis not present

## 2016-10-09 HISTORY — PX: COLONOSCOPY: SHX174

## 2017-04-08 DIAGNOSIS — Z1389 Encounter for screening for other disorder: Secondary | ICD-10-CM | POA: Diagnosis not present

## 2017-04-08 DIAGNOSIS — E1342 Other specified diabetes mellitus with diabetic polyneuropathy: Secondary | ICD-10-CM | POA: Diagnosis not present

## 2017-04-08 DIAGNOSIS — Z6830 Body mass index (BMI) 30.0-30.9, adult: Secondary | ICD-10-CM | POA: Diagnosis not present

## 2017-04-08 DIAGNOSIS — H9209 Otalgia, unspecified ear: Secondary | ICD-10-CM | POA: Diagnosis not present

## 2017-04-08 DIAGNOSIS — E6609 Other obesity due to excess calories: Secondary | ICD-10-CM | POA: Diagnosis not present

## 2017-04-08 DIAGNOSIS — N342 Other urethritis: Secondary | ICD-10-CM | POA: Diagnosis not present

## 2017-04-13 ENCOUNTER — Encounter (HOSPITAL_COMMUNITY): Payer: Self-pay | Admitting: Emergency Medicine

## 2017-04-13 ENCOUNTER — Inpatient Hospital Stay (HOSPITAL_COMMUNITY)
Admission: EM | Admit: 2017-04-13 | Discharge: 2017-04-16 | DRG: 378 | Disposition: A | Discharge status: Home / Self Care | Payer: 59 | Attending: Family Medicine | Admitting: Family Medicine

## 2017-04-13 DIAGNOSIS — Z823 Family history of stroke: Secondary | ICD-10-CM | POA: Diagnosis not present

## 2017-04-13 DIAGNOSIS — I959 Hypotension, unspecified: Secondary | ICD-10-CM | POA: Diagnosis present

## 2017-04-13 DIAGNOSIS — K219 Gastro-esophageal reflux disease without esophagitis: Secondary | ICD-10-CM | POA: Diagnosis present

## 2017-04-13 DIAGNOSIS — Z880 Allergy status to penicillin: Secondary | ICD-10-CM | POA: Diagnosis not present

## 2017-04-13 DIAGNOSIS — Z85048 Personal history of other malignant neoplasm of rectum, rectosigmoid junction, and anus: Secondary | ICD-10-CM | POA: Diagnosis present

## 2017-04-13 DIAGNOSIS — E119 Type 2 diabetes mellitus without complications: Secondary | ICD-10-CM | POA: Diagnosis present

## 2017-04-13 DIAGNOSIS — K254 Chronic or unspecified gastric ulcer with hemorrhage: Principal | ICD-10-CM | POA: Diagnosis present

## 2017-04-13 DIAGNOSIS — I10 Essential (primary) hypertension: Secondary | ICD-10-CM | POA: Diagnosis present

## 2017-04-13 DIAGNOSIS — Z85038 Personal history of other malignant neoplasm of large intestine: Secondary | ICD-10-CM | POA: Diagnosis not present

## 2017-04-13 DIAGNOSIS — Z8249 Family history of ischemic heart disease and other diseases of the circulatory system: Secondary | ICD-10-CM | POA: Diagnosis not present

## 2017-04-13 DIAGNOSIS — H9190 Unspecified hearing loss, unspecified ear: Secondary | ICD-10-CM | POA: Diagnosis present

## 2017-04-13 DIAGNOSIS — Z96612 Presence of left artificial shoulder joint: Secondary | ICD-10-CM | POA: Diagnosis present

## 2017-04-13 DIAGNOSIS — E861 Hypovolemia: Secondary | ICD-10-CM | POA: Diagnosis not present

## 2017-04-13 DIAGNOSIS — K922 Gastrointestinal hemorrhage, unspecified: Secondary | ICD-10-CM | POA: Diagnosis present

## 2017-04-13 DIAGNOSIS — I9589 Other hypotension: Secondary | ICD-10-CM | POA: Diagnosis not present

## 2017-04-13 DIAGNOSIS — Z9884 Bariatric surgery status: Secondary | ICD-10-CM

## 2017-04-13 DIAGNOSIS — Z23 Encounter for immunization: Secondary | ICD-10-CM | POA: Diagnosis not present

## 2017-04-13 DIAGNOSIS — K2970 Gastritis, unspecified, without bleeding: Secondary | ICD-10-CM | POA: Diagnosis present

## 2017-04-13 DIAGNOSIS — D62 Acute posthemorrhagic anemia: Secondary | ICD-10-CM | POA: Diagnosis present

## 2017-04-13 DIAGNOSIS — Z9889 Other specified postprocedural states: Secondary | ICD-10-CM | POA: Diagnosis not present

## 2017-04-13 DIAGNOSIS — Z791 Long term (current) use of non-steroidal anti-inflammatories (NSAID): Secondary | ICD-10-CM | POA: Diagnosis not present

## 2017-04-13 LAB — COMPREHENSIVE METABOLIC PANEL
ALT: 16 U/L (ref 14–54)
AST: 22 U/L (ref 15–41)
Albumin: 3 g/dL — ABNORMAL LOW (ref 3.5–5.0)
Alkaline Phosphatase: 65 U/L (ref 38–126)
Anion gap: 6 (ref 5–15)
BILIRUBIN TOTAL: 0.5 mg/dL (ref 0.3–1.2)
BUN: 65 mg/dL — AB (ref 6–20)
CHLORIDE: 105 mmol/L (ref 101–111)
CO2: 25 mmol/L (ref 22–32)
CREATININE: 0.81 mg/dL (ref 0.44–1.00)
Calcium: 8.4 mg/dL — ABNORMAL LOW (ref 8.9–10.3)
Glucose, Bld: 133 mg/dL — ABNORMAL HIGH (ref 65–99)
POTASSIUM: 4.1 mmol/L (ref 3.5–5.1)
Sodium: 136 mmol/L (ref 135–145)
TOTAL PROTEIN: 5.9 g/dL — AB (ref 6.5–8.1)

## 2017-04-13 LAB — URINALYSIS, ROUTINE W REFLEX MICROSCOPIC
BILIRUBIN URINE: NEGATIVE
GLUCOSE, UA: NEGATIVE mg/dL
HGB URINE DIPSTICK: NEGATIVE
Ketones, ur: NEGATIVE mg/dL
Leukocytes, UA: NEGATIVE
Nitrite: NEGATIVE
PH: 5 (ref 5.0–8.0)
Protein, ur: NEGATIVE mg/dL
SPECIFIC GRAVITY, URINE: 1.015 (ref 1.005–1.030)

## 2017-04-13 LAB — CBC
HCT: 24.1 % — ABNORMAL LOW (ref 36.0–46.0)
Hemoglobin: 8 g/dL — ABNORMAL LOW (ref 12.0–15.0)
MCH: 29.4 pg (ref 26.0–34.0)
MCHC: 33.2 g/dL (ref 30.0–36.0)
MCV: 88.6 fL (ref 78.0–100.0)
PLATELETS: 235 10*3/uL (ref 150–400)
RBC: 2.72 MIL/uL — AB (ref 3.87–5.11)
RDW: 13.4 % (ref 11.5–15.5)
WBC: 8 10*3/uL (ref 4.0–10.5)

## 2017-04-13 LAB — PROTIME-INR
INR: 1.06
Prothrombin Time: 13.7 seconds (ref 11.4–15.2)

## 2017-04-13 LAB — APTT: aPTT: 27 seconds (ref 24–36)

## 2017-04-13 LAB — ABO/RH: ABO/RH(D): O POS

## 2017-04-13 MED ORDER — FLUOXETINE HCL 10 MG PO CAPS
10.0000 mg | ORAL_CAPSULE | Freq: Every day | ORAL | Status: DC
  Administered 2017-04-15 – 2017-04-16 (×2): 10 mg via ORAL
  Filled 2017-04-13 (×2): qty 1

## 2017-04-13 MED ORDER — SODIUM CHLORIDE 0.9 % IV SOLN
INTRAVENOUS | Status: DC
  Administered 2017-04-13: 19:00:00 via INTRAVENOUS

## 2017-04-13 MED ORDER — PREGABALIN 75 MG PO CAPS
75.0000 mg | ORAL_CAPSULE | Freq: Two times a day (BID) | ORAL | Status: DC
  Administered 2017-04-14 – 2017-04-16 (×4): 75 mg via ORAL
  Filled 2017-04-13 (×4): qty 1

## 2017-04-13 MED ORDER — SODIUM CHLORIDE 0.9 % IV SOLN
10.0000 mL/h | Freq: Once | INTRAVENOUS | Status: AC
  Administered 2017-04-13: 10 mL/h via INTRAVENOUS

## 2017-04-13 MED ORDER — PANTOPRAZOLE SODIUM 40 MG IV SOLR: 40.0000 mg | Freq: Two times a day (BID) | INTRAVENOUS | Status: DC

## 2017-04-13 MED ORDER — ACETAMINOPHEN 325 MG PO TABS: 650.0000 mg | ORAL_TABLET | Freq: Four times a day (QID) | ORAL | Status: DC | PRN

## 2017-04-13 MED ORDER — FENTANYL CITRATE (PF) 100 MCG/2ML IJ SOLN: 50.0000 ug | INTRAMUSCULAR | Status: DC | PRN

## 2017-04-13 MED ORDER — PNEUMOCOCCAL VAC POLYVALENT 25 MCG/0.5ML IJ INJ: 0.5000 mL | INJECTION | INTRAMUSCULAR | Status: DC

## 2017-04-13 MED ORDER — ACETAMINOPHEN 650 MG RE SUPP: 650.0000 mg | Freq: Four times a day (QID) | RECTAL | Status: DC | PRN

## 2017-04-13 MED ORDER — SODIUM CHLORIDE 0.9 % IV SOLN
8.0000 mg/h | INTRAVENOUS | Status: DC
  Administered 2017-04-13 – 2017-04-14 (×3): 8 mg/h via INTRAVENOUS
  Filled 2017-04-13 (×8): qty 80

## 2017-04-13 MED ORDER — CYCLOBENZAPRINE HCL 10 MG PO TABS: 10.0000 mg | ORAL_TABLET | Freq: Three times a day (TID) | ORAL | Status: DC | PRN

## 2017-04-13 MED ORDER — HYDROCODONE-ACETAMINOPHEN 5-325 MG PO TABS: 1.0000 | ORAL_TABLET | ORAL | Status: DC | PRN

## 2017-04-13 MED ORDER — ONDANSETRON HCL 4 MG/2ML IJ SOLN
4.0000 mg | Freq: Once | INTRAMUSCULAR | Status: AC
  Administered 2017-04-13: 4 mg via INTRAVENOUS
  Filled 2017-04-13: qty 2

## 2017-04-13 MED ORDER — SODIUM CHLORIDE 0.9 % IV SOLN
80.0000 mg | Freq: Once | INTRAVENOUS | Status: AC
  Administered 2017-04-13: 80 mg via INTRAVENOUS
  Filled 2017-04-13: qty 80

## 2017-04-13 MED ORDER — PANTOPRAZOLE SODIUM 40 MG IV SOLR
INTRAVENOUS | Status: AC
  Filled 2017-04-13: qty 80

## 2017-04-13 MED ORDER — SODIUM CHLORIDE 0.9 % IV SOLN: INTRAVENOUS | Status: AC

## 2017-04-13 MED ORDER — SODIUM CHLORIDE 0.9% FLUSH
3.0000 mL | Freq: Two times a day (BID) | INTRAVENOUS | Status: DC
  Administered 2017-04-13 – 2017-04-15 (×5): 3 mL via INTRAVENOUS

## 2017-04-13 MED ORDER — INFLUENZA VAC SPLIT HIGH-DOSE 0.5 ML IM SUSY
0.5000 mL | PREFILLED_SYRINGE | INTRAMUSCULAR | Status: DC
  Filled 2017-04-13: qty 0.5

## 2017-04-13 MED ORDER — SODIUM CHLORIDE 0.9 % IV BOLUS (SEPSIS)
1000.0000 mL | Freq: Once | INTRAVENOUS | Status: AC
  Administered 2017-04-13: 1000 mL via INTRAVENOUS

## 2017-04-13 MED ORDER — SODIUM CHLORIDE 0.9 % IV SOLN: 8.0000 mg/h | INTRAVENOUS | Status: DC

## 2017-04-13 NOTE — H&P (Signed)
History and Physical    Lori Martinez [id number] DOB: Apr 14, 1951 DOA: 04/13/2017  PCP: Redmond School, MD   Patient coming from: Home  Chief Complaint: Hematemesis   HPI: Lori Martinez is a 66 y.o. female with medical history significant for history of colorectal cancer status post resection with radiation and adjuvant chemotherapy, GERD status post Nissen fundoplication, hypertension, and anxiety, now presenting to the emergency department for evaluation of hematemesis.  The patient reports that she been experiencing severe nausea today before noticing some black streaks in her stool and then developing bright red emesis.  She had a couple episodes of this at home prior to coming into the ED.  Denies experiencing similar symptoms previously.  She reports taking Aleve daily.  Denies chest pain or headache.  No known liver disease.  ED Course: Upon arrival to the ED, patient is found to be afebrile, saturating well on room air, with drop in blood pressure to 78/41, and normal heart rate.  EKG features a sinus rhythm with PAC, LVH, and repolarization abnormality.  Chemistry panel is notable for a BUN of 65 with normal creatinine.  CBC features a hemoglobin of 8.0, down from 11.8 last year.  INR is normal.  Patient was treated with a liter of normal saline, 80 mg IV Protonix, and 1 unit of packed red blood cells was ordered for immediate transfusion gastroenterology was consulted by the ED physician.  Blood pressure improved with the fluid bolus and has remained stable.  There has not been any further bleeding while in the ED.  Patient will be admitted to the stepdown unit for ongoing evaluation and management of acute upper GI bleed.  Review of Systems:  All other systems reviewed and apart from HPI, are negative.  Past Medical History:  Diagnosis Date  . Acid reflux   . Arthritis    hands and knees  . Cancer ([place])   . Cataract   . Change in voice   . Chest pain    seen 2010 (Dr. Elisabeth Cara,  [facility]) and had low risk stress test, EF 76%.  . CHF (congestive heart failure) ([place])   . Chronic cough   . Diabetes mellitus   . Frequent urination   . Headaches, cluster   . Hearing loss   . Hyperlipidemia   . Hypertension   . Neuropathy, peripheral   . Pneumonia    as a teenager  . Rotator cuff tear arthropathy    left  . Trouble swallowing   . Vomiting    as result of acid reflux   . Wears glasses     Past Surgical History:  Procedure Laterality Date  . ABDOMINAL HYSTERECTOMY    . BACK SURGERY  2010  . CHOLECYSTECTOMY    . COLON SURGERY  1990   colon/rectal cancer  . EYE SURGERY    . HERNIA REPAIR     hiatal hernia   . LAPAROSCOPIC NISSEN FUNDOPLICATION  0/73/71  . REVERSE SHOULDER ARTHROPLASTY Left 12/29/2015   Procedure: REVERSE SHOULDER ARTHROPLASTY;  Surgeon: Tania Ade, MD;  Location: [facility];  Service: Orthopedics;  Laterality: Left;  Left reverse total shoulder     reports that she has never smoked. She has never used smokeless tobacco. She reports that she does not drink alcohol or use drugs.  Allergies  Allergen Reactions  . Penicillins Rash and Other (See Comments)    Dry mouth and hives Has patient had a PCN reaction causing immediate rash, facial/tongue/throat swelling, SOB or  lightheadedness with hypotension: Yes Has patient had a PCN reaction causing severe rash involving mucus membranes or skin necrosis: No Has patient had a PCN reaction that required hospitalization No Has patient had a PCN reaction occurring within the last 10 years: No If all of the above answers are "NO  . Sulfonamide Derivatives Hives and Other (See Comments)    Dry mouth and hives    Family History  Problem Relation Age of Onset  . Stroke Mother   . Heart disease Father   . Heart disease Brother      Prior to Admission medications   Medication Sig Start Date End Date Taking? Authorizing Provider  albuterol (PROVENTIL HFA;VENTOLIN HFA) 108 (90 BASE) MCG/ACT inhaler  Inhale 1-2 puffs into the lungs every 6 (six) hours as needed for wheezing or shortness of breath.    [provider]  CRANBERRY PO Take 1 tablet by mouth daily.    [provider]  cyclobenzaprine (FLEXERIL) 10 MG tablet Take 1 tablet (10 mg total) by mouth 3 (three) times daily as needed for muscle spasms. Patient taking differently: Take 10 mg by mouth 3 (three) times daily.  10/23/14   Eustace Moore, MD  docusate sodium (COLACE) 100 MG capsule Take 1 capsule (100 mg total) by mouth 3 (three) times daily as needed. 12/30/15   Grier Mitts, PA-C  FLUoxetine (PROZAC) 10 MG capsule Take 10 mg by mouth daily.    [provider]  Multiple Vitamin (MULTIVITAMIN WITH MINERALS) TABS tablet Take 1 tablet by mouth daily. Centrum Silver    [provider]  oxyCODONE-acetaminophen (ROXICET) 5-325 MG tablet Take 1-2 tablets by mouth every 4 (four) hours as needed for severe pain. 12/30/15   Grier Mitts, PA-C  pregabalin (LYRICA) 75 MG capsule Take 75 mg by mouth 2 (two) times daily.    [provider]  ramipril (ALTACE) 10 MG tablet Take 10 mg by mouth 2 (two) times daily.     [provider]    Physical Exam: Vitals:   04/13/17 1902 04/13/17 1909 04/13/17 1930 04/13/17 2000  BP: (!) 78/41 (!) 97/59 (!) 99/57 (!) 102/47  Pulse: 88  85   Resp: 20 20 (!) 22 19  Temp:      TempSrc:      SpO2: 95%  98%   Weight:      Height:          Constitutional: NAD, pale, appears uncomfortable Eyes: PERTLA, lids and conjunctivae normal ENMT: Mucous membranes are moist. Posterior pharynx clear of any exudate or lesions.   Neck: normal, supple, no masses, no thyromegaly Respiratory: clear to auscultation bilaterally, no wheezing, no crackles. Normal respiratory effort. No accessory muscle use.  Cardiovascular: S1 & S2 heard, regular rate and rhythm. No significant JVD. Abdomen: No distension, soft, tender in epigastrium without rebound pain or  guarding. Bowel sounds active.  Musculoskeletal: no clubbing / cyanosis. No joint deformity upper and lower extremities.   Skin: no significant rashes, lesions, ulcers. Pale, poor turgor. Neurologic: CN 2-12 grossly intact. Sensation intact . Strength 5/5 in all 4 limbs.  Psychiatric:  Alert and oriented x 3. Calm, cooperative.     Labs on Admission: I have personally reviewed following labs and imaging studies  CBC:  Recent Labs Lab 04/13/17 1846  WBC 8.0  HGB 8.0*  HCT 24.1*  MCV 88.6  PLT 144   Basic Metabolic Panel:  Recent Labs Lab 04/13/17 1846  NA 136  K 4.1  CL 105  CO2 25  GLUCOSE 133*  BUN 65*  CREATININE 0.81  CALCIUM 8.4*   GFR: Estimated Creatinine Clearance: 60.1 mL/min (by C-G formula based on SCr of 0.81 mg/dL). Liver Function Tests:  Recent Labs Lab 04/13/17 1846  AST 22  ALT 16  ALKPHOS 65  BILITOT 0.5  PROT 5.9*  ALBUMIN 3.0*   No results for input(s): LIPASE, AMYLASE in the last 168 hours. No results for input(s): AMMONIA in the last 168 hours. Coagulation Profile:  Recent Labs Lab 04/13/17 1854  INR 1.06   Cardiac Enzymes: No results for input(s): CKTOTAL, CKMB, CKMBINDEX, TROPONINI in the last 168 hours. BNP (last 3 results) No results for input(s): PROBNP in the last 8760 hours. HbA1C: No results for input(s): HGBA1C in the last 72 hours. CBG: No results for input(s): GLUCAP in the last 168 hours. Lipid Profile: No results for input(s): CHOL, HDL, LDLCALC, TRIG, CHOLHDL, LDLDIRECT in the last 72 hours. Thyroid Function Tests: No results for input(s): TSH, T4TOTAL, FREET4, T3FREE, THYROIDAB in the last 72 hours. Anemia Panel: No results for input(s): VITAMINB12, FOLATE, FERRITIN, TIBC, IRON, RETICCTPCT in the last 72 hours. Urine analysis:    Component Value Date/Time   COLORURINE YELLOW 12/22/2015 1030   APPEARANCEUR CLEAR 12/22/2015 1030   LABSPEC 1.021 12/22/2015 1030   PHURINE 5.5 12/22/2015 1030   GLUCOSEU  NEGATIVE 12/22/2015 1030   HGBUR NEGATIVE 12/22/2015 1030   BILIRUBINUR NEGATIVE 12/22/2015 1030   KETONESUR NEGATIVE 12/22/2015 1030   PROTEINUR NEGATIVE 12/22/2015 1030   UROBILINOGEN 0.2 09/01/2008 1138   NITRITE NEGATIVE 12/22/2015 1030   LEUKOCYTESUR TRACE (A) 12/22/2015 1030   Sepsis Labs: @LABRCNTIP (procalcitonin:4,lacticidven:4) )No results found for this or any previous visit (from the past 240 hour(s)).   Radiological Exams on Admission: No results found.  EKG: Independently reviewed. Sinus rhythm, PAC, LVH with repolarization abnormality.   Assessment/Plan  1. Acute upper GI bleed, acute blood-loss anemia  - Pt presents following bright red emesis and melena at home  - BP dropped in ED and responded to IVF  - Hx of GERD s/p Nissen fundoplication, taking daily NSAID, not on H2-blocker or PPI at home  - BUN is markedly elevated  - Treated in ED with 1 liter NS, 80 mg IV Protonix, and 1 unit RBC ordered for immediate transfusion  - Plant to continue IVF, continue IV PPI, check post-transfusion CBC, follow-up on GI recommendations    2. Hypotension, hx of hypertension - Pt has hx of HTN managed with ramipril at home  - BP fell to 78/41 in ED and normalized with 1 liter NS  - Hold ramipril, transfuse 1 unit, continue IVF, monitor in stepdown unit    3. GERD s/p Nissen fundoplication  - Pt underwent Nissen fundoplication in 6213  - Not on PPI or H2-blocker at home, currently on IV PPI as above  - No recent EGD reports available    DVT prophylaxis: SCD's  Code Status: Full  Family Communication: Discussed with patient Disposition Plan: Admit to SDU Consults called: Gastroenterology Admission status: Inpatient    Vianne Bulls, MD [facility] Pager [phone]  If 7PM-7AM, please contact night-coverage [email or web address] Password [facility]  04/13/2017, 8:26 PM

## 2017-04-13 NOTE — ED Provider Notes (Signed)
[facility] EMERGENCY DEPARTMENT Provider Note   CSN: [id number] Arrival date & time: 04/13/17  1814     History   Chief Complaint Chief Complaint  Patient presents with  . Hematemesis    HPI Lori Martinez is a 66 y.o. female.  HPI Pt presents to the ED for hematemesis.  Patient had not been feeling well today.  She had been nauseated.  This evening she had a persistent sensation of nausea and then started vomiting blood.  This happened a couple of times.  She denies any prior history of rectal bleeding but she does take an Aleve daily.  Does not take any anticoagulants.  She has had gastric bypass surgery.  No complaints of blood in her stool.  No abdominal pain.  No chest pain. Past Medical History:  Diagnosis Date  . Acid reflux   . Arthritis    hands and knees  . Cancer ([place])   . Cataract   . Change in voice   . Chest pain    seen 2010 (Dr. Elisabeth Cara, [facility]) and had low risk stress test, EF 76%.  . CHF (congestive heart failure) ([place])   . Chronic cough   . Diabetes mellitus   . Frequent urination   . Headaches, cluster   . Hearing loss   . Hyperlipidemia   . Hypertension   . Neuropathy, peripheral   . Pneumonia    as a teenager  . Rotator cuff tear arthropathy    left  . Trouble swallowing   . Vomiting    as result of acid reflux   . Wears glasses     Patient Active Problem List   Diagnosis Date Noted  . 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 Cervicalgia 10/07/2015  . Lumbar stenosis with neurogenic claudication 10/20/2014  . CONTUSION OF KNEE 05/26/2008    Past Surgical History:  Procedure Laterality Date  . ABDOMINAL HYSTERECTOMY    . BACK SURGERY  2010  . CHOLECYSTECTOMY    . COLON SURGERY  1990   colon/rectal cancer  . EYE SURGERY    . HERNIA REPAIR     hiatal hernia   . LAPAROSCOPIC NISSEN FUNDOPLICATION  1/32/44  . REVERSE SHOULDER ARTHROPLASTY Left 12/29/2015   Procedure: REVERSE SHOULDER ARTHROPLASTY;  Surgeon: Tania Ade, MD;  Location: [facility];  Service: Orthopedics;  Laterality: Left;  Left reverse total shoulder    OB History    No data available       Home Medications    Prior to Admission medications   Medication Sig Start Date End Date Taking? Authorizing Provider  albuterol (PROVENTIL HFA;VENTOLIN HFA) 108 (90 BASE) MCG/ACT inhaler Inhale 1-2 puffs into the lungs every 6 (six) hours as needed for wheezing or shortness of breath.    [provider]  CRANBERRY PO Take 1 tablet by mouth daily.    [provider]  cyclobenzaprine (FLEXERIL) 10 MG tablet Take 1 tablet (10 mg total) by mouth 3 (three) times daily as needed for muscle spasms. Patient taking differently: Take 10 mg by mouth 3 (three) times daily.  10/23/14   Eustace Moore, MD  docusate sodium (COLACE) 100 MG capsule Take 1 capsule (100 mg total) by mouth 3 (three) times daily as needed. 12/30/15   Grier Mitts, PA-C  FLUoxetine (PROZAC) 10 MG capsule Take 10 mg by mouth daily.    [provider]  Multiple Vitamin (MULTIVITAMIN WITH MINERALS) TABS tablet Take 1 tablet by mouth daily. Centrum  Silver    [provider]  oxyCODONE-acetaminophen (ROXICET) 5-325 MG tablet Take 1-2 tablets by mouth every 4 (four) hours as needed for severe pain. 12/30/15   Grier Mitts, PA-C  pregabalin (LYRICA) 75 MG capsule Take 75 mg by mouth 2 (two) times daily.    [provider]  ramipril (ALTACE) 10 MG tablet Take 10 mg by mouth 2 (two) times daily.     [provider]    Family History Family History  Problem Relation Age of Onset  . Stroke Mother   . Heart disease Father   . Heart disease Brother     Social History Social History  Substance Use Topics  . Smoking status: Never Smoker  . Smokeless tobacco: Never Used  . Alcohol use No     Allergies   Penicillins and Sulfonamide derivatives   Review of Systems Review of Systems  All other systems reviewed and are  negative.    Physical Exam Updated Vital Signs BP (!) 97/59   Pulse 88   Temp 99.1 F (37.3 C) (Oral)   Resp 20   Ht 1.524 m (5')   Wt 73 kg (161 lb)   SpO2 95%   BMI 31.44 kg/m   Physical Exam  HENT:  Head: Normocephalic and atraumatic.  Right Ear: External ear normal.  Left Ear: External ear normal.  Eyes: Conjunctivae are normal. Right eye exhibits no discharge. Left eye exhibits no discharge. No scleral icterus.  Neck: Neck supple. No tracheal deviation present.  Cardiovascular: Normal rate, regular rhythm and intact distal pulses.   Pulmonary/Chest: Effort normal and breath sounds normal. No stridor. No respiratory distress. She has no wheezes. She has no rales.  Abdominal: Soft. Bowel sounds are normal. She exhibits no distension. There is no tenderness. There is no rebound and no guarding.  Genitourinary:  Genitourinary Comments: No melena or bright red blood  Musculoskeletal: She exhibits no edema or tenderness.  Neurological: She is alert. She has normal strength. No cranial nerve deficit (no facial droop, extraocular movements intact, no slurred speech) or sensory deficit. She exhibits normal muscle tone. She displays no seizure activity. Coordination normal.  Skin: Skin is warm and dry. No rash noted. She is not diaphoretic.  Psychiatric: She has a normal mood and affect.  Nursing note and vitals reviewed.    ED Treatments / Results  Labs (all labs ordered are listed, but only abnormal results are displayed) Labs Reviewed  COMPREHENSIVE METABOLIC PANEL - Abnormal; Notable for the following:       Result Value   Glucose, Bld 133 (*)    BUN 65 (*)    Calcium 8.4 (*)    Total Protein 5.9 (*)    Albumin 3.0 (*)    All other components within normal limits  CBC - Abnormal; Notable for the following:    RBC 2.72 (*)    Hemoglobin 8.0 (*)    HCT 24.1 (*)    All other components within normal limits  APTT  PROTIME-INR  POC OCCULT BLOOD, ED  TYPE AND SCREEN    PREPARE RBC (CROSSMATCH)    EKG  EKG Interpretation  Date/Time:  Saturday April 13 2017 18:27:50 EDT Ventricular Rate:  96 PR Interval:    QRS Duration: 100 QT Interval:  367 QTC Calculation: 464 R Axis:   -39 Text Interpretation:  Sinus tachycardia Atrial premature complex , new since last tracing LVH with secondary repolarization abnormality Since last tracing rate faster Confirmed by Dorie Rank (  [id number]) on 04/13/2017 6:36:53 PM       Radiology No results found.  Procedures .Critical Care Performed by: Dorie Rank Authorized by: Dorie Rank   Critical care provider statement:    Critical care time (minutes):  40   Critical care was necessary to treat or prevent imminent or life-threatening deterioration of the following conditions: gi bleed.   Critical care was time spent personally by me on the following activities:  Discussions with consultants, evaluation of patient's response to treatment, examination of patient, ordering and performing treatments and interventions, ordering and review of laboratory studies, ordering and review of radiographic studies, pulse oximetry, re-evaluation of patient's condition, obtaining history from patient or surrogate and review of old charts   (including critical care time)  Medications Ordered in ED Medications  0.9 %  sodium chloride infusion (not administered)  pantoprazole (PROTONIX) 80 mg in sodium chloride 0.9 % 100 mL IVPB (not administered)  0.9 %  sodium chloride infusion (not administered)  ondansetron (ZOFRAN) injection 4 mg (4 mg Intravenous Given 04/13/17 1914)  sodium chloride 0.9 % bolus 1,000 mL (1,000 mLs Intravenous New Bag/Given 04/13/17 1912)     Initial Impression / Assessment and Plan / ED Course  I have reviewed the triage vital signs and the nursing notes.  Pertinent labs & imaging results that were available during my care of the patient were reviewed by me and considered in my medical decision making (see  chart for details).  Clinical Course as of Apr 14 2007  Sat Apr 13, 2017  2001 8.0.  This is down from 11 1 year ago.  Patient also had transient drop in her blood pressure that responded to IV fluids.  Considering her anemia, GI bleeding and hypotension I have ordered blood transfusions  [JK]  2004 No recurrent hematemesis while in the ED  [JK]    Clinical Course User Index [JK] Dorie Rank, MD    Patient presented to the emergency room with complaints of hematemesis.  Her laboratory tests are notable for anemia with a hemoglobin of 8.  Patient also had hypotension while in the emergency room that responded to IV fluids.  Blood pressure is holding in the high 90s.  She has not had any recurrent episodes of hematemesis.  There is no bright red blood noted on rectal exam.  Patient has a history of gastric bypass but no complications associated with that surgery.  She does take Aleve daily which could be contributing to her symptoms.  I have ordered IV blood transfusions.  I will consult with gastroenterology and the hospitalist service for hospital admission and further workup.   Final Clinical Impressions(s) / ED Diagnoses   Final diagnoses:  Acute upper GI bleed  Acute blood loss anemia    New Prescriptions New Prescriptions   No medications on file     Dorie Rank, MD 04/13/17 2009

## 2017-04-13 NOTE — ED Triage Notes (Signed)
Pt reports noting black streaks in stool today along with vomiting bright red blood and nausea.  Denies this occurring before.  States she takes Aleve daily.

## 2017-04-14 ENCOUNTER — Encounter (HOSPITAL_COMMUNITY): Payer: Self-pay | Admitting: Gastroenterology

## 2017-04-14 ENCOUNTER — Encounter (HOSPITAL_COMMUNITY)
Admission: EM | Disposition: A | Discharge status: Home / Self Care | Payer: Self-pay | Source: Home / Self Care | Attending: Family Medicine

## 2017-04-14 ENCOUNTER — Other Ambulatory Visit: Payer: Self-pay

## 2017-04-14 DIAGNOSIS — Z85038 Personal history of other malignant neoplasm of large intestine: Secondary | ICD-10-CM

## 2017-04-14 DIAGNOSIS — Z9889 Other specified postprocedural states: Secondary | ICD-10-CM

## 2017-04-14 DIAGNOSIS — E861 Hypovolemia: Secondary | ICD-10-CM

## 2017-04-14 DIAGNOSIS — I9589 Other hypotension: Secondary | ICD-10-CM

## 2017-04-14 HISTORY — PX: ESOPHAGOGASTRODUODENOSCOPY: SHX5428

## 2017-04-14 LAB — COMPREHENSIVE METABOLIC PANEL
ALT: 13 U/L — ABNORMAL LOW (ref 14–54)
ANION GAP: 6 (ref 5–15)
AST: 17 U/L (ref 15–41)
Albumin: 2.7 g/dL — ABNORMAL LOW (ref 3.5–5.0)
Alkaline Phosphatase: 60 U/L (ref 38–126)
BILIRUBIN TOTAL: 0.3 mg/dL (ref 0.3–1.2)
BUN: 57 mg/dL — AB (ref 6–20)
CHLORIDE: 107 mmol/L (ref 101–111)
CO2: 25 mmol/L (ref 22–32)
Calcium: 8 mg/dL — ABNORMAL LOW (ref 8.9–10.3)
Creatinine, Ser: 0.75 mg/dL (ref 0.44–1.00)
GFR calc Af Amer: >60 mL/min (ref >60)
Glucose, Bld: 95 mg/dL (ref 65–99)
POTASSIUM: 4 mmol/L (ref 3.5–5.1)
Sodium: 138 mmol/L (ref 135–145)
TOTAL PROTEIN: 5.4 g/dL — AB (ref 6.5–8.1)

## 2017-04-14 LAB — CBC
HEMATOCRIT: 22.2 % — AB (ref 36.0–46.0)
HEMOGLOBIN: 7.2 g/dL — AB (ref 12.0–15.0)
MCH: 28.8 pg (ref 26.0–34.0)
MCHC: 32.4 g/dL (ref 30.0–36.0)
MCV: 88.8 fL (ref 78.0–100.0)
Platelets: 228 10*3/uL (ref 150–400)
RBC: 2.5 MIL/uL — AB (ref 3.87–5.11)
RDW: 13.6 % (ref 11.5–15.5)
WBC: 7.9 10*3/uL (ref 4.0–10.5)

## 2017-04-14 LAB — HEMOGLOBIN AND HEMATOCRIT, BLOOD
HCT: 24 % — ABNORMAL LOW (ref 36.0–46.0)
HEMATOCRIT: 25.3 % — AB (ref 36.0–46.0)
HEMATOCRIT: 25.2 % — AB (ref 36.0–46.0)
HEMATOCRIT: 23.5 % — AB (ref 36.0–46.0)
HEMOGLOBIN: 8.4 g/dL — AB (ref 12.0–15.0)
HEMOGLOBIN: 8.2 g/dL — AB (ref 12.0–15.0)
HEMOGLOBIN: 8 g/dL — AB (ref 12.0–15.0)
Hemoglobin: 7.9 g/dL — ABNORMAL LOW (ref 12.0–15.0)

## 2017-04-14 LAB — GLUCOSE, CAPILLARY
GLUCOSE-CAPILLARY: 80 mg/dL (ref 65–99)
GLUCOSE-CAPILLARY: 94 mg/dL (ref 65–99)

## 2017-04-14 LAB — PREPARE RBC (CROSSMATCH)

## 2017-04-14 LAB — MRSA PCR SCREENING: MRSA BY PCR: NEGATIVE

## 2017-04-14 SURGERY — EGD (ESOPHAGOGASTRODUODENOSCOPY)
Anesthesia: Moderate Sedation

## 2017-04-14 MED ORDER — LIDOCAINE VISCOUS 2 % MT SOLN
OROMUCOSAL | Status: DC | PRN
  Administered 2017-04-14: 1 via OROMUCOSAL

## 2017-04-14 MED ORDER — METOCLOPRAMIDE HCL 5 MG/ML IJ SOLN
10.0000 mg | Freq: Once | INTRAMUSCULAR | Status: AC
  Administered 2017-04-14: 10 mg via INTRAVENOUS
  Filled 2017-04-14: qty 2

## 2017-04-14 MED ORDER — SODIUM CHLORIDE 0.9 % IV BOLUS (SEPSIS)
500.0000 mL | Freq: Once | INTRAVENOUS | Status: AC
  Administered 2017-04-14: 500 mL via INTRAVENOUS

## 2017-04-14 MED ORDER — SIMETHICONE 40 MG/0.6ML PO SUSP
ORAL | Status: DC | PRN
  Administered 2017-04-14: 12:00:00

## 2017-04-14 MED ORDER — MIDAZOLAM HCL 5 MG/5ML IJ SOLN
INTRAMUSCULAR | Status: AC
  Filled 2017-04-14: qty 10

## 2017-04-14 MED ORDER — INFLUENZA VAC SPLIT HIGH-DOSE 0.5 ML IM SUSY
0.5000 mL | PREFILLED_SYRINGE | Freq: Once | INTRAMUSCULAR | Status: DC
  Filled 2017-04-14: qty 0.5

## 2017-04-14 MED ORDER — MEPERIDINE HCL 100 MG/ML IJ SOLN
INTRAMUSCULAR | Status: DC | PRN
  Administered 2017-04-14 (×2): 25 mg via INTRAVENOUS

## 2017-04-14 MED ORDER — PNEUMOCOCCAL VAC POLYVALENT 25 MCG/0.5ML IJ INJ
0.5000 mL | INJECTION | Freq: Once | INTRAMUSCULAR | Status: DC
  Filled 2017-04-14: qty 0.5

## 2017-04-14 MED ORDER — SODIUM CHLORIDE 0.9 % IJ SOLN
PREFILLED_SYRINGE | INTRAMUSCULAR | Status: DC | PRN
  Administered 2017-04-14: 3 mL

## 2017-04-14 MED ORDER — PROMETHAZINE HCL 25 MG/ML IJ SOLN
6.2500 mg | Freq: Once | INTRAMUSCULAR | Status: AC
  Administered 2017-04-14: 6.25 mg via INTRAVENOUS
  Filled 2017-04-14: qty 1

## 2017-04-14 MED ORDER — MIDAZOLAM HCL 5 MG/5ML IJ SOLN
INTRAMUSCULAR | Status: DC | PRN
  Administered 2017-04-14 (×2): 2 mg via INTRAVENOUS

## 2017-04-14 MED ORDER — MEPERIDINE HCL 100 MG/ML IJ SOLN
INTRAMUSCULAR | Status: AC
  Filled 2017-04-14: qty 2

## 2017-04-14 MED ORDER — EPINEPHRINE PF 1 MG/10ML IJ SOSY
PREFILLED_SYRINGE | INTRAMUSCULAR | Status: AC
  Filled 2017-04-14: qty 10

## 2017-04-14 NOTE — Op Note (Signed)
450 NACL given during procedure and D/C

## 2017-04-14 NOTE — Consult Note (Addendum)
Referring Provider: No ref. provider found Primary Care Physician:  Redmond School, MD Primary Gastroenterologist:  Dr. Juanita Craver, LAST VISIT < 1 YEAR  Reason for Consultation:  Hematemesis   Impression: ADMITTED WITH HEMATEMESIS WHILE TAKING ALEVE QHS W/O A PPI. DIFFERENTIAL DIAGNOSIS INCLUDES:PUD,  LESS LIKELY MALLORY WEISS TEAR,  DIEULAFOY'S LESION, OR AVM.  Plan: 1. EGD/POSSIBLE CAUTERY OR CLIP PLACEMENT TODAY. DISCUSSED PROCEDURE, BENEFITS, & RISKS WITH PT AND HUSBAND: < 1% chance of medication reaction, perforation, transfer to [facility], SURGICAL INTERVENTION, OR bleeding. 2. PROTONIX IV 3. REGLAN 10 MG IV NOW. 4. PHENERGAN 6.25 MG IV AFTER  CONSENT OBTAINED. 5. CALLED BLOOD BANK([facility])-6 UNIT O POS AVAILABLE, 1 READY FOR PT @0938 .    HPI:  CONSULT CALLED BY RN @0917 . PT IN HER USUAL STATE OF HEALTH AND CAME TO ED WITH NAUSEA/VOMITING AND HEMATEMESIS. TAKES ALEVE W/O PPI EVERY NIGHT FOR BACK PAIN AND TO WIND DOWN. 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 SAT GOT UP AND THEN FELT NAUSEATED. LAID BACK DOWN WENT TO THE BR AND HAD SEVER NAUSEA. GOT UP AFTERNOON AND VOMITED BRB BUT THOUGHT IT WAS SOMETHING SHE ATE. VOMITED BRB 2-3 MORE TIMES AND HAD HUSBAND LOOKED IN TRASH CAN AND HE KNEW IT WAS BLOOD AND THEY CAME TO THE ED. DENIED BLACK TARRY STOOL HAS INTERMITTENT CONSTIPATION AND DIARRHEA. HAS HAD EGD AND TCS IN THE PAST.   PT DENIES FEVER, CHILLS, HEMATOCHEZIA, melena,  CHEST PAIN, SHORTNESS OF BREATH, CHANGE IN BOWEL IN HABITS, abdominal pain, problems swallowing, problems with sedation, OR heartburn or indigestion.    Past Medical History:  Diagnosis Date  . Acid reflux   . Arthritis    hands and knees  . Cancer ([place])   . Cataract   . Change in voice   . Chest pain    seen 2010 ([facility]. Elisabeth Cara, [facility]) and had low risk stress test, EF 76%.  . CHF (congestive heart failure) ([place])   . Chronic cough   . Diabetes mellitus   . Frequent urination   . Headaches, cluster   . Hearing  loss   . Hyperlipidemia   . Hypertension   . Neuropathy, peripheral   . Pneumonia    as a teenager  . Rotator cuff tear arthropathy    left  . Trouble swallowing   . Vomiting    as result of acid reflux   . Wears glasses     Past Surgical History:  Procedure Laterality Date  . ABDOMINAL HYSTERECTOMY    . BACK SURGERY  2010  . CHOLECYSTECTOMY    . COLON SURGERY  1990   colon/rectal cancer  . EYE SURGERY    . HERNIA REPAIR     hiatal hernia   . LAPAROSCOPIC NISSEN FUNDOPLICATION  2/99/37    Prior to Admission medications   Medication Sig Start Date End Date Taking? Authorizing Provider  CRANBERRY PO Take 1 tablet by mouth daily.      cyclobenzaprine (FLEXERIL) 10 MG tablet Take 1 tablet (10 mg total) by mouth 3 (three) times daily as needed for muscle spasms. Patient taking differently: Take 10 mg by mouth 3 (three) times daily.       FLUoxetine (PROZAC) 10 MG capsule Take 10 mg by mouth daily.      naproxen sodium (ALEVE) 220 MG tablet Take 440 mg by mouth daily as needed.      pregabalin (LYRICA) 75 MG capsule Take 75 mg by mouth 2 (two) times daily.  ramipril (ALTACE) 10 MG tablet Take 10 mg by mouth 2 (two) times daily.         Current Facility-Administered Medications  Medication Dose Route Frequency Provider Last Rate Last Dose  . acetaminophen (TYLENOL) tablet 650 mg  650 mg Oral Q6H PRN      Or  . acetaminophen (TYLENOL) suppository 650 mg  650 mg Rectal Q6H PRN     . cyclobenzaprine (FLEXERIL) tablet 10 mg  10 mg Oral TID PRN     . fentaNYL (SUBLIMAZE) injection 50 mcg  50 mcg Intravenous Q2H PRN     . FLUoxetine (PROZAC) capsule 10 mg  10 mg Oral Daily     . HYDROcodone-acetaminophen (NORCO/VICODIN) 5-325 MG per tablet 1-2 tablet  1-2 tablet Oral Q4H PRN     . Influenza vac split quadrivalent PF (FLUZONE HIGH-DOSE) injection 0.5 mL  0.5 mL Intramuscular Tomorrow-1000     . pantoprazole (PROTONIX) 80 mg in sodium chloride 0.9 % 250 mL (0.32 mg/mL) infusion   8 mg/hr Intravenous Continuous     . [START ON 04/17/2017] pantoprazole (PROTONIX) injection 40 mg  40 mg Intravenous Q12H     . pneumococcal 23 valent vaccine (PNU-IMMUNE) injection 0.5 mL  0.5 mL Intramuscular Tomorrow-1000     . pregabalin (LYRICA) capsule 75 mg  75 mg Oral BID     . sodium chloride flush (NS) 0.9 % injection 3 mL  3 mL Intravenous Q12H       Allergies as of 04/13/2017 - Review Complete 04/13/2017  Allergen Reaction Noted  . Penicillins Rash and Other (See Comments) 05/26/2008  . Sulfonamide derivatives Hives and Other (See Comments) 05/26/2008    Family History  Problem Relation Age of Onset  . Stroke Mother   . Heart disease Father   . Heart disease Brother      Social History   Socioeconomic History  . Marital status: Married    Spouse name: Not on file  . Number of children: Not on file  . Years of education: Not on file  . Highest education level: Not on file  Social Needs  . Financial resource strain: Not on file  . Food insecurity - worry: Not on file  . Food insecurity - inability: Not on file  . Transportation needs - medical: Not on file  . Transportation needs - non-medical: Not on file  Occupational History  . Not on file  Tobacco Use  . Smoking status: Never Smoker  . Smokeless tobacco: Never Used  Substance and Sexual Activity  . Alcohol use: No  . Drug use: No  . Sexual activity: Not on file  Other Topics Concern  . Not on file  Social History Narrative  . Not on file    Review of Systems: PER HPI OTHERWISE ALL SYSTEMS ARE NEGATIVE.   Vitals: Blood pressure (!) 101/39, pulse 72, temperature 98.5 F (36.9 C), temperature source Oral, resp. rate 17, height 5\' 2"  (1.575 m), weight 169 lb 15.6 oz (77.1 kg), SpO2 96 %.  Physical Exam: General:   Alert,  Well-developed, well-nourished, pleasant and cooperative in NAD Head:  Normocephalic and atraumatic. Eyes:  Sclera clear, no icterus.   Conjunctiva pink. Mouth:  No lesions,  dentition ABnormal. Neck:  Supple; no masses. Lungs:  Clear throughout to auscultation.   No wheezes. No acute distress. Heart:  Regular rate and rhythm; no murmurs. Abdomen:  Soft, nontender and nondistended. No masses noted. Normal bowel sounds, without guarding, and without rebound.  Msk:  Symmetrical without gross deformities. Normal posture. Extremities:  Without edema. Neurologic:  Alert and  oriented x4;  grossly normal neurologically. Cervical Nodes:  No significant cervical adenopathy. Psych:  Alert and cooperative. ANXIOUS mood and FLAT affect.   Lab Results: Recent Labs    04/13/17 1846 04/14/17 0413  WBC 8.0 7.9  HGB 8.0* 7.2*  HCT 24.1* 22.2*  PLT 235 228   BMET Recent Labs    04/13/17 1846 04/14/17 0412  NA 136 138  K 4.1 4.0  CL 105 107  CO2 25 25  GLUCOSE 133* 95  BUN 65* 57*  CREATININE 0.81 0.75  CALCIUM 8.4* 8.0*   LFT Recent Labs    04/14/17 0412  PROT 5.4*  ALBUMIN 2.7*  AST 17  ALT 13*  ALKPHOS 60  BILITOT 0.3     Studies/Results: NONE   LOS: 1 day   Filmore Molyneux  04/14/2017, 8:53 AM

## 2017-04-14 NOTE — Progress Notes (Signed)
Blood infusing, as ordered, at this time.

## 2017-04-14 NOTE — Progress Notes (Signed)
[facility] for update on blood availability. Blood acquisition is in process at [facility] and Nursing will be made aware when this is available for dispensing.

## 2017-04-14 NOTE — Progress Notes (Signed)
PROGRESS NOTE    Lori Martinez  KDX:[id number] DOB: 10/10/1950 DOA: 04/13/2017 PCP: Redmond School, MD    Brief Narrative:  Lori Martinez is a 66 y.o. female with medical history significant for history of colorectal cancer status post resection with radiation and adjuvant chemotherapy, GERD status post Nissen fundoplication, hypertension, and anxiety, now presenting to the emergency department for evaluation of hematemesis.  The patient reports that she been experiencing severe nausea today before noticing some black streaks in her stool and then developing bright red emesis.  She had a couple episodes of this at home prior to coming into the ED.  Denies experiencing similar symptoms previously.  She reports taking Aleve daily.  Denies chest pain or headache.  No known liver disease.  ED Course: Upon arrival to the ED, patient is found to be afebrile, saturating well on room air, with drop in blood pressure to 78/41, and normal heart rate.  EKG features a sinus rhythm with PAC, LVH, and repolarization abnormality.  Chemistry panel is notable for a BUN of 65 with normal creatinine.  CBC features a hemoglobin of 8.0, down from 11.8 last year.  INR is normal.  Patient was treated with a liter of normal saline, 80 mg IV Protonix, and 1 unit of packed red blood cells was ordered for immediate transfusion gastroenterology was consulted by the ED physician.  Blood pressure improved with the fluid bolus and has remained stable.  There has not been any further bleeding while in the ED.  Patient will be admitted to the stepdown unit for ongoing evaluation and management of acute upper GI bleed.   Assessment & Plan:   Principal Problem:   Acute upper GI bleed Active Problems:   Acute blood loss anemia   Hypotension   History of colorectal cancer   History of Nissen fundoplication   Acute upper GI bleed, acute blood-loss anemia  - Pt presents following bright red emesis and melena at home  - BP  dropped in ED and responded to IVF  - Hx of GERD s/p Nissen fundoplication, taking daily NSAID, not on H2-blocker or PPI at home  - BUN is markedly elevated  - Treated in ED with 1 liter NS, 80 mg IV Protonix, and 1 unit RBC ordered for immediate transfusion  - f/u post transfusion CBC - vitals stable - GI consulted - H/H q4h - 2 large bore iv  Hypotension, hx of hypertension - Pt has hx of HTN managed with ramipril at home  - BP fell to 78/41 in ED and normalized with 1 liter NS  - Hold ramipril - transfuse 1 unit - continue IVF  GERD s/p Nissen fundoplication  - Pt underwent Nissen fundoplication in 9767  - IV PPI as above  - No recent EGD reports available    DVT prophylaxis: SCD's  Code Status: Full  Family Communication: Discussed with patient and husband who is bedside Disposition Plan: pending GI evaluation   Consultants:   Gastroenterology  Procedures:   None  Antimicrobials:   None    Subjective: Patient seen after blood transfusion completed.  Says she feels a little better.  Reports no shortness of breath, abdominal pain or chest pain.  Says she feels tired.  No bowel movements or emesis since time of admission.  Objective: Vitals:   04/14/17 0500 04/14/17 0545 04/14/17 0600 04/14/17 0700  BP: (!) 93/52 (!) 92/45 (!) 93/44 (!) 101/46  Pulse: 75 67 70 69  Resp: (!) 21 12  16 10  Temp:  98.5 F (36.9 C)    TempSrc:      SpO2: 96% 98% 97% 98%  Weight:      Height:        Intake/Output Summary (Last 24 hours) at 04/14/2017 0738 Last data filed at 04/14/2017 0600 Gross per 24 hour  Intake 2742.33 ml  Output -  Net 2742.33 ml   Filed Weights   04/13/17 1828 04/13/17 2122  Weight: 73 kg (161 lb) 77.1 kg (169 lb 15.6 oz)    Examination:  General exam: Appears calm and comfortable  Respiratory system: Clear to auscultation. Respiratory effort normal. Cardiovascular system: S1 & S2 heard, RRR. No JVD, murmurs, rubs, gallops or clicks. No  pedal edema. Gastrointestinal system: Abdomen is nondistended, soft and nontender. No organomegaly or masses felt. Normal bowel sounds heard. Central nervous system: Alert and oriented. No focal neurological deficits. Extremities: Symmetric 5 x 5 power. Skin: No rashes, lesions or ulcers Psychiatry: Judgement and insight appear normal. Mood & affect appropriate.     Data Reviewed: I have personally reviewed following labs and imaging studies  CBC: Recent Labs  Lab 04/13/17 1846 04/14/17 0413  WBC 8.0 7.9  HGB 8.0* 7.2*  HCT 24.1* 22.2*  MCV 88.6 88.8  PLT 235 376   Basic Metabolic Panel: Recent Labs  Lab 04/13/17 1846 04/14/17 0412  NA 136 138  K 4.1 4.0  CL 105 107  CO2 25 25  GLUCOSE 133* 95  BUN 65* 57*  CREATININE 0.81 0.75  CALCIUM 8.4* 8.0*   GFR: Estimated Creatinine Clearance: 65.6 mL/min (by C-G formula based on SCr of 0.75 mg/dL). Liver Function Tests: Recent Labs  Lab 04/13/17 1846 04/14/17 0412  AST 22 17  ALT 16 13*  ALKPHOS 65 60  BILITOT 0.5 0.3  PROT 5.9* 5.4*  ALBUMIN 3.0* 2.7*   No results for input(s): LIPASE, AMYLASE in the last 168 hours. No results for input(s): AMMONIA in the last 168 hours. Coagulation Profile: Recent Labs  Lab 04/13/17 1854  INR 1.06   Cardiac Enzymes: No results for input(s): CKTOTAL, CKMB, CKMBINDEX, TROPONINI in the last 168 hours. BNP (last 3 results) No results for input(s): PROBNP in the last 8760 hours. HbA1C: No results for input(s): HGBA1C in the last 72 hours. CBG: No results for input(s): GLUCAP in the last 168 hours. Lipid Profile: No results for input(s): CHOL, HDL, LDLCALC, TRIG, CHOLHDL, LDLDIRECT in the last 72 hours. Thyroid Function Tests: No results for input(s): TSH, T4TOTAL, FREET4, T3FREE, THYROIDAB in the last 72 hours. Anemia Panel: No results for input(s): VITAMINB12, FOLATE, FERRITIN, TIBC, IRON, RETICCTPCT in the last 72 hours. Sepsis Labs: No results for input(s):  PROCALCITON, LATICACIDVEN in the last 168 hours.  No results found for this or any previous visit (from the past 240 hour(s)).       Radiology Studies: No results found.      Scheduled Meds: . FLUoxetine  10 mg Oral Daily  . Influenza vac split quadrivalent PF  0.5 mL Intramuscular Tomorrow-1000  . [START ON 04/17/2017] pantoprazole  40 mg Intravenous Q12H  . pneumococcal 23 valent vaccine  0.5 mL Intramuscular Tomorrow-1000  . pregabalin  75 mg Oral BID  . sodium chloride flush  3 mL Intravenous Q12H   Continuous Infusions: . sodium chloride Stopped (04/14/17 0600)  . pantoprozole (PROTONIX) infusion 8 mg/hr (04/14/17 0600)     LOS: 1 day    Time spent: 35 min    Lanney Gins  Adair Patter, MD [facility] Pager [phone]  If 7PM-7AM, please contact night-coverage [email or web address] Password [facility] 04/14/2017, 7:38 AM

## 2017-04-14 NOTE — Progress Notes (Signed)
Blood products still not available at this time.

## 2017-04-14 NOTE — Op Note (Signed)
[facility] Patient Name: Lori Martinez Procedure Date: 04/14/2017 10:17 AM MRN: [id number] Date of Birth: 1950-12-25 Attending MD: Barney Drain MD, MD CSN: [id number] Age: 66 Admit Type: Inpatient Procedure:                Upper GI endoscopy WITH COLD FORCEPS BIOPSY/SQ                            INJ/CLIPS x3 Indications:              Hematemesis ON NAPROXEN W/O PPI Providers:                Barney Drain MD, MD, Janeece Riggers, RN, Randa Spike, Technician Referring MD:             Redmond School, MD Medicines:                Promethazine 6.25 mg IV, Meperidine 50 mg IV,                            Midazolam 4 mg IV Complications:            No immediate complications. Estimated Blood Loss:     Estimated blood loss was minimal. Procedure:                Pre-Anesthesia Assessment:                           - Prior to the procedure, a History and Physical                            was performed, and patient medications and                            allergies were reviewed. The patient's tolerance of                            previous anesthesia was also reviewed. The risks                            and benefits of the procedure and the sedation                            options and risks were discussed with the patient.                            All questions were answered, and informed consent                            was obtained. Prior Anticoagulants: The patient has                            taken naproxen, last dose was 1 day prior to  procedure. ASA Grade Assessment: II - A patient                            with mild systemic disease. After reviewing the                            risks and benefits, the patient was deemed in                            satisfactory condition to undergo the procedure.                            After obtaining informed consent, the endoscope was                            passed under  direct vision. Throughout the                            procedure, the patient's blood pressure, pulse, and                            oxygen saturations were monitored continuously.The                            upper GI endoscopy was accomplished without                            difficulty. The patient tolerated the procedure                            well. The EG-[id number]) scope was introduced                            through the mouth, and advanced to the second part                            of duodenum. Scope In: 10:59:24 AM Scope Out: 11:12:50 AM Total Procedure Duration: 0 hours 13 minutes 26 seconds  Findings:      The examined esophagus was normal.      One non-bleeding cratered gastric ulcer with pigmented material was       found in the gastric body and on the greater curvature of the stomach.       The lesion was 8 mm in largest dimension. Area was successfully injected       with 4 mL of a 1:10,000 solution of epinephrine for drug delivery. For       hemostasis, three hemostatic clips were successfully placed (MR       conditional). There was no bleeding at the end of the procedure.      Diffuse mild inflammation characterized by congestion (edema) and       erythema was found in the gastric antrum. Biopsies(3: antrum/angularis,       2: gatric body) were taken with a cold forceps for Helicobacter pylori       testing. Impression:               -  HEMATEMESIS DUE TO Non-bleeding gastric ulcer                            with pigmented material. .                           - MILD Gastritis. Biopsied. Moderate Sedation:      Moderate (conscious) sedation was administered by the endoscopy nurse       and supervised by the endoscopist. The following parameters were       monitored: oxygen saturation, heart rate, blood pressure, and response       to care. Total physician intraservice time was 20 minutes. Recommendation:           - Await pathology results. NEED KUB  TO CONFIRM                            CLIPS HAVE PASSED PRIOR TO MRI.                           - Repeat upper endoscopy in 3 months for                            surveillance.                           - Low fat diet.                           - Continue present medications. AVOID IBUPROFEN AND                            NAPROXEN FOR 3 MOS. ASA/ANTICOAGULATION OK IF                            MEDICALLY INDICATED.                           - Return patient to hospital ward for ongoing care.                           - Return to GI office in 2 months with DR. MANN. Procedure Code(s):        --- Professional ---                           [phone], Moderate sedation services provided by the                            same physician or other qualified health care                            professional performing the diagnostic or                            therapeutic service that the sedation supports,  requiring the presence of an independent trained                            observer to assist in the monitoring of the                            patient's level of consciousness and physiological                            status; initial 15 minutes of intraservice time,                            patient age 41 years or older Diagnosis Code(s):        --- Professional ---                           K25.9, Gastric ulcer, unspecified as acute or                            chronic, without hemorrhage or perforation                           K29.70, Gastritis, unspecified, without bleeding                           K92.0, Hematemesis CPT copyright 2016 American Medical Association. All rights reserved. The codes documented in this report are preliminary and upon coder review [place]  be revised to meet current compliance requirements. Barney Drain, MD Barney Drain MD, MD 04/14/2017 11:27:19 AM This report has been signed electronically. Number of Addenda: 0

## 2017-04-14 NOTE — Progress Notes (Signed)
CBC was drawn prior to blood infusion due to delay in receiving blood from blood bank due to testing being conducted at [facility].

## 2017-04-15 ENCOUNTER — Telehealth: Payer: Self-pay | Admitting: Gastroenterology

## 2017-04-15 ENCOUNTER — Encounter (HOSPITAL_COMMUNITY): Payer: Self-pay | Admitting: Gastroenterology

## 2017-04-15 DIAGNOSIS — D62 Acute posthemorrhagic anemia: Secondary | ICD-10-CM

## 2017-04-15 DIAGNOSIS — K922 Gastrointestinal hemorrhage, unspecified: Secondary | ICD-10-CM

## 2017-04-15 LAB — HEMOGLOBIN AND HEMATOCRIT, BLOOD
HCT: 22.8 % — ABNORMAL LOW (ref 36.0–46.0)
HCT: 29.5 % — ABNORMAL LOW (ref 36.0–46.0)
HCT: 28.9 % — ABNORMAL LOW (ref 36.0–46.0)
HEMATOCRIT: 23.9 % — AB (ref 36.0–46.0)
HEMATOCRIT: 30.3 % — AB (ref 36.0–46.0)
HEMATOCRIT: 30.8 % — AB (ref 36.0–46.0)
HEMOGLOBIN: 9.7 g/dL — AB (ref 12.0–15.0)
HEMOGLOBIN: 10 g/dL — AB (ref 12.0–15.0)
HEMOGLOBIN: 10.2 g/dL — AB (ref 12.0–15.0)
Hemoglobin: 7.7 g/dL — ABNORMAL LOW (ref 12.0–15.0)
Hemoglobin: 7.4 g/dL — ABNORMAL LOW (ref 12.0–15.0)
Hemoglobin: 9.8 g/dL — ABNORMAL LOW (ref 12.0–15.0)

## 2017-04-15 LAB — GLUCOSE, CAPILLARY: Glucose-Capillary: 95 mg/dL (ref 65–99)

## 2017-04-15 LAB — OCCULT BLOOD, POC DEVICE: Fecal Occult Bld: NEGATIVE

## 2017-04-15 LAB — PREPARE RBC (CROSSMATCH)

## 2017-04-15 MED ORDER — INFLUENZA VAC SPLIT HIGH-DOSE 0.5 ML IM SUSY
0.5000 mL | PREFILLED_SYRINGE | INTRAMUSCULAR | Status: AC
  Administered 2017-04-16: 0.5 mL via INTRAMUSCULAR
  Filled 2017-04-15: qty 0.5

## 2017-04-15 MED ORDER — PANTOPRAZOLE SODIUM 40 MG IV SOLR: 40.0000 mg | Freq: Two times a day (BID) | INTRAVENOUS | Status: DC

## 2017-04-15 MED ORDER — SODIUM CHLORIDE 0.9 % IV SOLN
Freq: Once | INTRAVENOUS | Status: AC
  Administered 2017-04-15: 10:00:00 via INTRAVENOUS

## 2017-04-15 MED ORDER — PNEUMOCOCCAL VAC POLYVALENT 25 MCG/0.5ML IJ INJ
0.5000 mL | INJECTION | INTRAMUSCULAR | Status: AC
  Administered 2017-04-16: 0.5 mL via INTRAMUSCULAR
  Filled 2017-04-15: qty 0.5

## 2017-04-15 MED ORDER — PANTOPRAZOLE SODIUM 40 MG PO TBEC
40.0000 mg | DELAYED_RELEASE_TABLET | Freq: Two times a day (BID) | ORAL | Status: DC
  Administered 2017-04-15 – 2017-04-16 (×3): 40 mg via ORAL
  Filled 2017-04-15 (×2): qty 1

## 2017-04-15 NOTE — Progress Notes (Signed)
Patient received 1 unit of blood this shift, Hgb increased to 10.0 at this time. Patient bathed and OOB up in chair. Patient reports that she feels much better now than she did yesterday. Vss.

## 2017-04-15 NOTE — Progress Notes (Signed)
PROGRESS NOTE    Lori Martinez  RKY:[id number] DOB: 01/19/1951 DOA: 04/13/2017 PCP: Redmond School, MD    Brief Narrative:  Lori Martinez is a 66 y.o. female with medical history significant for history of colorectal cancer status post resection with radiation and adjuvant chemotherapy, GERD status post Nissen fundoplication, hypertension, and anxiety, now presenting to the emergency department for evaluation of hematemesis.  The patient reports that she been experiencing severe nausea today before noticing some black streaks in her stool and then developing bright red emesis.  She had a couple episodes of this at home prior to coming into the ED.  Denies experiencing similar symptoms previously.  She reports taking Aleve daily.  Denies chest pain or headache.  No known liver disease.  ED Course: Upon arrival to the ED, patient is found to be afebrile, saturating well on room air, with drop in blood pressure to 78/41, and normal heart rate.  EKG features a sinus rhythm with PAC, LVH, and repolarization abnormality.  Chemistry panel is notable for a BUN of 65 with normal creatinine.  CBC features a hemoglobin of 8.0, down from 11.8 last year.  INR is normal.  Patient was treated with a liter of normal saline, 80 mg IV Protonix, and 1 unit of packed red blood cells was ordered for immediate transfusion gastroenterology was consulted by the ED physician.  Blood pressure improved with the fluid bolus and has remained stable.  There has not been any further bleeding while in the ED.  Patient will be admitted to the stepdown unit for ongoing evaluation and management of acute upper GI bleed.   Assessment & Plan:   Principal Problem:   Acute upper GI bleed Active Problems:   Acute blood loss anemia   Hypotension   History of colorectal cancer   History of Nissen fundoplication   Acute upper GI bleed, acute blood-loss anemia  - Pt presents following bright red emesis and melena at home  - BP  dropped in ED and responded to IVF  - Hx of GERD s/p Nissen fundoplication, taking daily NSAID, not on H2-blocker or PPI at home  - GI consulted - H/H q4h - 2 large bore iv - patient H/H continued to slowly drop since transfusion yesterday - will transfuse another unit PRBC - continue serial H/H - IV PPI  Hypotension, hx of hypertension - Pt has hx of HTN managed with ramipril at home  - BP fell to 78/41 in ED and normalized with 1 liter NS  - Hold ramipril - s/p 1 unit PRBC - will order additional unit - continue IVF  GERD s/p Nissen fundoplication  - Pt underwent Nissen fundoplication in 3151  - IV PPI as above  - No recent EGD reports available    DVT prophylaxis: SCD's  Code Status: Full  Family Communication: Discussed with patient and husband who is bedside Disposition Plan: if stable can consider d/c tomorrow   Consultants:   Gastroenterology  Procedures:   None  Antimicrobials:   None    Subjective: This am patient is upset she has to stay an additional day.  Denies any bright red blood per rectum or hematemesis.  Had upper endoscopy yesterday with cautery and clips inserted.  Objective: Vitals:   04/15/17 0400 04/15/17 0500 04/15/17 0600 04/15/17 0728  BP: (!) 106/41 (!) 94/56 (!) 96/57   Pulse: 73 70 75 74  Resp: 16 14 18 13   Temp: 98.1 F (36.7 C)   98.8 F (  37.1 C)  TempSrc: Oral   Oral  SpO2: 97% 96% 96% 96%  Weight:  75.5 kg (166 lb 7.2 oz)    Height:        Intake/Output Summary (Last 24 hours) at 04/15/2017 0815 Last data filed at 04/15/2017 0000 Gross per 24 hour  Intake 450 ml  Output -  Net 450 ml   Filed Weights   04/13/17 1828 04/13/17 2122 04/15/17 0500  Weight: 73 kg (161 lb) 77.1 kg (169 lb 15.6 oz) 75.5 kg (166 lb 7.2 oz)    Examination:  Physical Exam  Constitutional: She appears well-developed and well-nourished.  Cardiovascular: Normal rate, regular rhythm and normal heart sounds.  Pulmonary/Chest: Effort  normal and breath sounds normal.  Abdominal: Soft. Bowel sounds are normal.  Skin: Skin is warm and dry.  Psychiatric: She has a normal mood and affect. Judgment normal.        Data Reviewed: I have personally reviewed following labs and imaging studies  CBC: Recent Labs  Lab 04/13/17 1846 04/14/17 0413  04/14/17 1304 04/14/17 1721 04/14/17 2032 04/15/17 0041 04/15/17 0352  WBC 8.0 7.9  --   --   --   --   --   --   HGB 8.0* 7.2*   < > 8.2* 8.0* 7.9* 7.7* 7.4*  HCT 24.1* 22.2*   < > 25.2* 24.0* 23.5* 22.8* 23.9*  MCV 88.6 88.8  --   --   --   --   --   --   PLT 235 228  --   --   --   --   --   --    < > = values in this interval not displayed.   Basic Metabolic Panel: Recent Labs  Lab 04/13/17 1846 04/14/17 0412  NA 136 138  K 4.1 4.0  CL 105 107  CO2 25 25  GLUCOSE 133* 95  BUN 65* 57*  CREATININE 0.81 0.75  CALCIUM 8.4* 8.0*   GFR: Estimated Creatinine Clearance: 65 mL/min (by C-G formula based on SCr of 0.75 mg/dL). Liver Function Tests: Recent Labs  Lab 04/13/17 1846 04/14/17 0412  AST 22 17  ALT 16 13*  ALKPHOS 65 60  BILITOT 0.5 0.3  PROT 5.9* 5.4*  ALBUMIN 3.0* 2.7*   No results for input(s): LIPASE, AMYLASE in the last 168 hours. No results for input(s): AMMONIA in the last 168 hours. Coagulation Profile: Recent Labs  Lab 04/13/17 1854  INR 1.06   Cardiac Enzymes: No results for input(s): CKTOTAL, CKMB, CKMBINDEX, TROPONINI in the last 168 hours. BNP (last 3 results) No results for input(s): PROBNP in the last 8760 hours. HbA1C: No results for input(s): HGBA1C in the last 72 hours. CBG: Recent Labs  Lab 04/14/17 0755 04/14/17 1636 04/15/17 0727  GLUCAP 80 94 95   Lipid Profile: No results for input(s): CHOL, HDL, LDLCALC, TRIG, CHOLHDL, LDLDIRECT in the last 72 hours. Thyroid Function Tests: No results for input(s): TSH, T4TOTAL, FREET4, T3FREE, THYROIDAB in the last 72 hours. Anemia Panel: No results for input(s):  VITAMINB12, FOLATE, FERRITIN, TIBC, IRON, RETICCTPCT in the last 72 hours. Sepsis Labs: No results for input(s): PROCALCITON, LATICACIDVEN in the last 168 hours.  Recent Results (from the past 240 hour(s))  MRSA PCR Screening     Status: None   Collection Time: 04/13/17  9:17 PM  Result Value Ref Range Status   MRSA by PCR NEGATIVE NEGATIVE Final    Comment:  The GeneXpert MRSA Assay (FDA approved for NASAL specimens only), is one component of a comprehensive MRSA colonization surveillance program. It is not intended to diagnose MRSA infection nor to guide or monitor treatment for MRSA infections.          Radiology Studies: No results found.      Scheduled Meds: . FLUoxetine  10 mg Oral Daily  . Influenza vac split quadrivalent PF  0.5 mL Intramuscular Once  . pantoprazole  40 mg Oral BID AC  . pneumococcal 23 valent vaccine  0.5 mL Intramuscular Once  . pregabalin  75 mg Oral BID  . sodium chloride flush  3 mL Intravenous Q12H   Continuous Infusions: . sodium chloride    . pantoprozole (PROTONIX) infusion 8 mg/hr (04/15/17 0000)     LOS: 2 days    Time spent: 30 min    Loretha Stapler, MD [facility] Pager [phone]  If 7PM-7AM, please contact night-coverage [email or web address] Password [email or web address] 04/15/2017, 8:15 AM

## 2017-04-15 NOTE — Progress Notes (Signed)
Patient has requested to receive the flu & pneumonia vaccines tomorrow due to currently receiving blood and wishes not to be stuck again at this time. Time changed for tomorrow morning per pt request.

## 2017-04-15 NOTE — Care Management Note (Signed)
Case Management Note  Patient Details  Name: Lori Martinez MRN: [id number] Date of Birth: [birth year]  Subjective/Objective:  Adm from home UGIB. From home with family. Ind with ADL's. Has PCP, still drives herself to appointments. No HH or DME pta. Reports no issues affording medications.                   Action/Plan: Anticipate DC home with self care. No CM needs known or communicated.    Expected Discharge Date:    04/15/2017              Expected Discharge Plan:  Home/Self Care  In-House Referral:     Discharge planning Services  CM Consult  Post Acute Care Choice:  NA Choice offered to:  NA  DME Arranged:    DME Agency:     HH Arranged:    HH Agency:     Status of Service:  Completed, signed off  If discussed at [employer] of Stay Meetings, dates discussed:    Additional Comments:  Mignonne Afonso, Chauncey Reading, RN 04/15/2017, 11:40 AM

## 2017-04-15 NOTE — H&P (Signed)
Information on proper diet, foods to eat and foods to avoid, medications (NAIDs) to avoid with ulcer disease provided to patient & patient's husband, handouts given.

## 2017-04-15 NOTE — Telephone Encounter (Signed)
Please arrange outpatient follow-up in 2 months. Will need to arrange EGD at that time. Inpatient with PUD.

## 2017-04-15 NOTE — Progress Notes (Signed)
    Subjective: No overt GI bleeding. No N/V. No abdominal pain. Upset that she has to stay another day. Family member at bedside.   Objective: Vital signs in last 24 hours: Temp:  [98.1 F (36.7 C)-98.8 F (37.1 C)] 98.8 F (37.1 C) (11/05 0728) Pulse Rate:  [58-86] 74 (11/05 0728) Resp:  [11-49] 13 (11/05 0728) BP: (86-123)/(37-59) 96/57 (11/05 0600) SpO2:  [3 %-100 %] 96 % (11/05 0728) FiO2 (%):  [28 %] 28 % (11/04 2000) Weight:  [166 lb 7.2 oz (75.5 kg)] 166 lb 7.2 oz (75.5 kg) (11/05 0500)   General:   Alert and oriented, flat affect Head:  Normocephalic and atraumatic. Abdomen:  Bowel sounds present, soft, non-tender, non-distended. No HSM or hernias noted. No rebound or guarding. No masses appreciated  Neurologic:  Alert and  oriented x4 Psych:  Alert and cooperative.   Intake/Output from previous day: 11/04 0701 - 11/05 0700 In: 035 [I.V.:450; Blood:374] Out: -  Intake/Output this shift: No intake/output data recorded.  Lab Results: Recent Labs    04/13/17 1846 04/14/17 0413  04/14/17 2032 04/15/17 0041 04/15/17 0352  WBC 8.0 7.9  --   --   --   --   HGB 8.0* 7.2*   < > 7.9* 7.7* 7.4*  HCT 24.1* 22.2*   < > 23.5* 22.8* 23.9*  PLT 235 228  --   --   --   --    < > = values in this interval not displayed.   BMET Recent Labs    04/13/17 1846 04/14/17 0412  NA 136 138  K 4.1 4.0  CL 105 107  CO2 25 25  GLUCOSE 133* 95  BUN 65* 57*  CREATININE 0.81 0.75  CALCIUM 8.4* 8.0*   LFT Recent Labs    04/13/17 1846 04/14/17 0412  PROT 5.9* 5.4*  ALBUMIN 3.0* 2.7*  AST 22 17  ALT 16 13*  ALKPHOS 65 60  BILITOT 0.5 0.3   PT/INR Recent Labs    04/13/17 1854  LABPROT 13.7  INR 1.06    Assessment: 66 year old female admitted with hematemesis in setting of NSAIDs without a PPI. EGD yesterday with one non-bleeding cratered gastric ulcer with pigmented material in gastric body and on greater curvature of stomach, s/p epi injection and clips X 3.  Admitting Hgb 8.0 and received 1 unit this admission on 11/4. Due to drift in Hgb noted this morning down to 7.4, an additional unit has been ordered per attending. No overt GI bleeding noted. Clinically stable. Anticipate discharge tomorrow if remains stable.    Plan: EGD in 3 months for surveillance  Protonix 40 mg po BID Discontinue PPI infusion.  1 unit PRBCs per hospitalist: agree [place] have heart healthy diet: discussed with nursing  Hopefully will be appropriate for discharge in next 24 hours if remains without evidence of overt GI bleeding and clinically stable Will arrange outpatient follow-up.   Annitta Needs, PhD, ANP-BC [facility] G[facility]      LOS: 2 days    04/15/2017, 8:08 AM

## 2017-04-16 ENCOUNTER — Encounter: Payer: Self-pay | Admitting: Gastroenterology

## 2017-04-16 LAB — HEMOGLOBIN AND HEMATOCRIT, BLOOD
HEMATOCRIT: 28.3 % — AB (ref 36.0–46.0)
HEMATOCRIT: 28.6 % — AB (ref 36.0–46.0)
HEMATOCRIT: 31.7 % — AB (ref 36.0–46.0)
HEMOGLOBIN: 9.4 g/dL — AB (ref 12.0–15.0)
HEMOGLOBIN: 10.4 g/dL — AB (ref 12.0–15.0)
Hemoglobin: 9.3 g/dL — ABNORMAL LOW (ref 12.0–15.0)

## 2017-04-16 LAB — GLUCOSE, CAPILLARY: GLUCOSE-CAPILLARY: 104 mg/dL — AB (ref 65–99)

## 2017-04-16 MED ORDER — PANTOPRAZOLE SODIUM 40 MG PO TBEC: 40.0000 mg | DELAYED_RELEASE_TABLET | Freq: Two times a day (BID) | ORAL | 0 refills | Status: DC

## 2017-04-16 NOTE — Discharge Summary (Signed)
Physician Discharge Summary  Lori Martinez WJX:[id number] DOB: [birth year] DOA: 04/13/2017  PCP: Redmond School, MD  Admit date: 04/13/2017 Discharge date: 04/16/2017  Admitted From: Home  Disposition:  Home   Recommendations for Outpatient Follow-up:  1. Follow up with PCP in 1-2 weeks 2. See GI within the next 2-3 weeks; EGD to be scheduled within the next 2 months 3. Please obtain BMP/CBC in one week 4. Medications as prescribed   Home Health: None  Equipment/Devices:None    Discharge Condition: Stable CODE STATUS: Full code Diet recommendation: Heart Healthy  Brief/Interim Summary: Lori Martinez a 66 y.o.femalewith medical history significant forhistory of colorectal cancer status post resection with radiation and adjuvantchemotherapy, GERD status post Nissen fundoplication, hypertension, and anxiety, now presenting to the emergency department for evaluation of hematemesis. The patient reports that she been experiencing severe nausea today before noticing some black streaks in her stool and then developing bright red emesis. She had a couple episodes of this at home prior to coming into the ED. Denies experiencing similar symptoms previously. She reports taking Aleve daily. Denies chest pain orheadache.No known liver disease.  ED Course:Upon arrival to the ED, patient is found to be afebrile, saturating well on room air, with drop in blood pressure to 78/41, and normal heart rate. EKG features a sinus rhythm with PAC, LVH, and repolarization abnormality. Chemistry panel is notable for a BUN of 65 with normal creatinine. CBC features a hemoglobin of 8.0, down from 11.8 last year. INR is normal. Patient was treated with a liter of normal saline, 80 mg IV Protonix, and 1 unit of packed red blood cells was ordered for immediate transfusion gastroenterology was consulted by the ED physician. Blood pressure improved with the fluid bolus and has remained stable. There has  not been any further bleeding while in the ED. Patient will be admitted to the stepdown unit for ongoing evaluation and management of acute upper GI bleed.  Patient received 1 unit PRBC and underwent EGD.  EGD showed non bleeding ulcer that was cauterized and clipped.  Her H/H went down after the procedure and she was transfused another unit.  On day of discharge her H/H had been stable after her second blood transfusion and she had no signs of bleeding such as hematemesis or hematochezia.  She was instructed to follow up with GI as an outpatient.    Discharge Diagnoses:  Principal Problem:   Acute upper GI bleed Active Problems:   Acute blood loss anemia   Hypotension   History of colorectal cancer   History of Nissen fundoplication    Discharge Instructions  Discharge Instructions    Call MD for:  difficulty breathing, headache or visual disturbances   Complete by:  As directed    Call MD for:  extreme fatigue   Complete by:  As directed    Call MD for:  hives   Complete by:  As directed    Call MD for:  persistant dizziness or light-headedness   Complete by:  As directed    Call MD for:  persistant nausea and vomiting   Complete by:  As directed    Call MD for:  redness, tenderness, or signs of infection (pain, swelling, redness, odor or green/yellow discharge around incision site)   Complete by:  As directed    Call MD for:  severe uncontrolled pain   Complete by:  As directed    Call MD for:  temperature >100.4   Complete by:  As  directed    Diet - low sodium heart healthy   Complete by:  As directed    Discharge instructions   Complete by:  As directed    Take PPI as instructed Avoid NSAIDs F/u with GI outpatient   Increase activity slowly   Complete by:  As directed      Allergies as of 04/16/2017      Reactions   Penicillins Rash, Other (See Comments)   Dry mouth and hives Has patient had a PCN reaction causing immediate rash, facial/tongue/throat swelling,  SOB or lightheadedness with hypotension: Yes Has patient had a PCN reaction causing severe rash involving mucus membranes or skin necrosis: No Has patient had a PCN reaction that required hospitalization No Has patient had a PCN reaction occurring within the last 10 years: No If all of the above answers are "NO   Sulfonamide Derivatives Hives, Other (See Comments)   Dry mouth and hives      Medication List    STOP taking these medications   naproxen sodium 220 MG tablet Commonly known as:  ALEVE   ramipril 10 MG tablet Commonly known as:  ALTACE     TAKE these medications   CRANBERRY PO Take 1 tablet by mouth daily.   cyclobenzaprine 10 MG tablet Commonly known as:  FLEXERIL Take 1 tablet (10 mg total) by mouth 3 (three) times daily as needed for muscle spasms. What changed:  when to take this   FLUoxetine 10 MG capsule Commonly known as:  PROZAC Take 10 mg by mouth daily.   pantoprazole 40 MG tablet Commonly known as:  PROTONIX Take 1 tablet (40 mg total) 2 (two) times daily before a meal by mouth.   pregabalin 75 MG capsule Commonly known as:  LYRICA Take 75 mg by mouth 2 (two) times daily.      Follow-up Information    Redmond School, MD. Schedule an appointment as soon as possible for a visit in 1 week(s).   Specialty:  Internal Medicine Contact information: [address] [place] [address] [phone]        Annitta Needs, NP. Schedule an appointment as soon as possible for a visit in 2 month(s).   Specialty:  Gastroenterology Contact information: [place] [address] [phone]          Allergies  Allergen Reactions  . Penicillins Rash and Other (See Comments)    Dry mouth and hives Has patient had a PCN reaction causing immediate rash, facial/tongue/throat swelling, SOB or lightheadedness with hypotension: Yes Has patient had a PCN reaction causing severe rash involving mucus membranes or skin necrosis: No Has  patient had a PCN reaction that required hospitalization No Has patient had a PCN reaction occurring within the last 10 years: No If all of the above answers are "NO  . Sulfonamide Derivatives Hives and Other (See Comments)    Dry mouth and hives    Consultations:  Gastroenterology   Procedures/Studies:  No results found.  EGD:  - HEMATEMESIS DUE TO Non-bleeding gastric ulcer                            with pigmented material. .                           - MILD Gastritis. Biopsied.  Subjective: Patient feels well.  Very anxious to go home.  Denies any blood  in emesis or stool.  Discharge Exam: Vitals:   04/16/17 1100 04/16/17 1200  BP: (!) 100/46 (!) 105/41  Pulse: 65 73  Resp: 17 (!) 27  Temp:    SpO2: 97% 96%   Vitals:   04/16/17 0900 04/16/17 1000 04/16/17 1100 04/16/17 1200  BP: (!) 99/35 (!) 100/37 (!) 100/46 (!) 105/41  Pulse: 77 70 65 73  Resp: 20 18 17  (!) 27  Temp:      TempSrc:      SpO2: 99% 96% 97% 96%  Weight:      Height:        General: Pt is alert, awake, not in acute distress Cardiovascular: RRR, S1/S2 +, no rubs, no gallops Respiratory: CTA bilaterally, no wheezing, no rhonchi Abdominal: Soft, NT, ND, bowel sounds + Extremities: no edema, no cyanosis    The results of significant diagnostics from this hospitalization (including imaging, microbiology, ancillary and laboratory) are listed below for reference.     Microbiology: Recent Results (from the past 240 hour(s))  MRSA PCR Screening     Status: None   Collection Time: 04/13/17  9:17 PM  Result Value Ref Range Status   MRSA by PCR NEGATIVE NEGATIVE Final    Comment:        The GeneXpert MRSA Assay (FDA approved for NASAL specimens only), is one component of a comprehensive MRSA colonization surveillance program. It is not intended to diagnose MRSA infection nor to guide or monitor treatment for MRSA infections.      Labs: BNP (last 3 results) No results for input(s): BNP  in the last 8760 hours. Basic Metabolic Panel: Recent Labs  Lab 04/13/17 1846 04/14/17 0412  NA 136 138  K 4.1 4.0  CL 105 107  CO2 25 25  GLUCOSE 133* 95  BUN 65* 57*  CREATININE 0.81 0.75  CALCIUM 8.4* 8.0*   Liver Function Tests: Recent Labs  Lab 04/13/17 1846 04/14/17 0412  AST 22 17  ALT 16 13*  ALKPHOS 65 60  BILITOT 0.5 0.3  PROT 5.9* 5.4*  ALBUMIN 3.0* 2.7*   No results for input(s): LIPASE, AMYLASE in the last 168 hours. No results for input(s): AMMONIA in the last 168 hours. CBC: Recent Labs  Lab 04/13/17 1846 04/14/17 0413  04/15/17 1623 04/15/17 2059 04/16/17 0041 04/16/17 0426 04/16/17 0837  WBC 8.0 7.9  --   --   --   --   --   --   HGB 8.0* 7.2*   < > 10.2* 9.8* 9.3* 9.4* 10.4*  HCT 24.1* 22.2*   < > 30.8* 28.9* 28.3* 28.6* 31.7*  MCV 88.6 88.8  --   --   --   --   --   --   PLT 235 228  --   --   --   --   --   --    < > = values in this interval not displayed.   Cardiac Enzymes: No results for input(s): CKTOTAL, CKMB, CKMBINDEX, TROPONINI in the last 168 hours. BNP: Invalid input(s): POCBNP CBG: Recent Labs  Lab 04/14/17 0755 04/14/17 1636 04/15/17 0727 04/16/17 0725  GLUCAP 80 94 95 104*   D-Dimer No results for input(s): DDIMER in the last 72 hours. Hgb A1c No results for input(s): HGBA1C in the last 72 hours. Lipid Profile No results for input(s): CHOL, HDL, LDLCALC, TRIG, CHOLHDL, LDLDIRECT in the last 72 hours. Thyroid function studies No results for input(s): TSH, T4TOTAL, T3FREE, THYROIDAB in the last 72  hours.  Invalid input(s): FREET3 Anemia work up No results for input(s): VITAMINB12, FOLATE, FERRITIN, TIBC, IRON, RETICCTPCT in the last 72 hours. Urinalysis    Component Value Date/Time   COLORURINE STRAW (A) 04/13/2017 2046   APPEARANCEUR CLEAR 04/13/2017 2046   LABSPEC 1.015 04/13/2017 2046   PHURINE 5.0 04/13/2017 2046   GLUCOSEU NEGATIVE 04/13/2017 2046   HGBUR NEGATIVE 04/13/2017 2046   BILIRUBINUR NEGATIVE  04/13/2017 2046   KETONESUR NEGATIVE 04/13/2017 2046   PROTEINUR NEGATIVE 04/13/2017 2046   UROBILINOGEN 0.2 09/01/2008 1138   NITRITE NEGATIVE 04/13/2017 2046   LEUKOCYTESUR NEGATIVE 04/13/2017 2046   Sepsis Labs Invalid input(s): PROCALCITONIN,  WBC,  LACTICIDVEN Microbiology Recent Results (from the past 240 hour(s))  MRSA PCR Screening     Status: None   Collection Time: 04/13/17  9:17 PM  Result Value Ref Range Status   MRSA by PCR NEGATIVE NEGATIVE Final    Comment:        The GeneXpert MRSA Assay (FDA approved for NASAL specimens only), is one component of a comprehensive MRSA colonization surveillance program. It is not intended to diagnose MRSA infection nor to guide or monitor treatment for MRSA infections.      Time coordinating discharge: 35 minutes  SIGNED:   Loretha Stapler, MD  [facility] 04/16/2017, 1:14 PM Pager [phone] If 7PM-7AM, please contact night-coverage [email or web address] Password [email or web address]

## 2017-04-16 NOTE — Telephone Encounter (Signed)
PATIENT SCHEDULED  °

## 2017-04-16 NOTE — Progress Notes (Signed)
Orders received for discharge. Instructions including medications and follow up appointments discussed with pt and her husband. All questions answered; understanding verbalized. PIV removed with no complications. Taken out by NT via [facility] with all belongings.

## 2017-04-17 LAB — TYPE AND SCREEN
ABO/RH(D): O POS
Antibody Screen: POSITIVE
UNIT DIVISION: 0
UNIT DIVISION: 0
UNIT DIVISION: 0

## 2017-04-17 LAB — BPAM RBC
BLOOD PRODUCT EXPIRATION DATE: 201811292359
BLOOD PRODUCT EXPIRATION DATE: 201811302359
Blood Product Expiration Date: 201812012359
ISSUE DATE / TIME: 201811040414
ISSUE DATE / TIME: 201811050930
UNIT TYPE AND RH: 5100
Unit Type and Rh: 5100
Unit Type and Rh: 5100

## 2017-04-24 ENCOUNTER — Telehealth: Payer: Self-pay | Admitting: Gastroenterology

## 2017-04-24 NOTE — Telephone Encounter (Addendum)
PLEASE CALL PT. HER STOMACH BIOPSIES SHOW GASTRITIS AND ULCER DUE TO NAPROXEN. CONTINUE PROTONIX BID FOR 3 MOS THEN ONCE DAILY. REPEAT EGD IN 3 MOS TO MAKE SURE THE ULCER IS HEALED. PT SHOULD LET us KNOW IF SHE NEED FOLLOW UP WITH DR. MANN OR WITH DR. [place] Zou. SHE DOES NOT TO SEE BOTH OF [place].

## 2017-04-25 NOTE — Telephone Encounter (Signed)
cc'ed to pcp °

## 2017-04-25 NOTE — Telephone Encounter (Signed)
OV made °

## 2017-04-25 NOTE — Telephone Encounter (Signed)
LMOVM

## 2017-04-26 NOTE — Telephone Encounter (Signed)
LMOVM

## 2017-04-26 NOTE — Telephone Encounter (Signed)
Mailing a letter to call for results.

## 2017-04-29 NOTE — Telephone Encounter (Signed)
I spoke with patient about results and she verbalized understanding and had no questions 

## 2017-04-30 ENCOUNTER — Telehealth: Payer: Self-pay | Admitting: Gastroenterology

## 2017-04-30 NOTE — Telephone Encounter (Signed)
Called and left Vm for pt to call pharmacy. The Rx was sent in on 04/16/2017 and confirmed received . Please call if any further questions.

## 2017-04-30 NOTE — Telephone Encounter (Signed)
[facility] came in stating that [facility] not [facility] for [facility]ix?  Please advise

## 2017-06-17 DIAGNOSIS — Z85048 Personal history of other malignant neoplasm of rectum, rectosigmoid junction, and anus: Secondary | ICD-10-CM | POA: Diagnosis not present

## 2017-06-17 DIAGNOSIS — C189 Malignant neoplasm of colon, unspecified: Secondary | ICD-10-CM | POA: Diagnosis not present

## 2017-06-17 DIAGNOSIS — E1140 Type 2 diabetes mellitus with diabetic neuropathy, unspecified: Secondary | ICD-10-CM | POA: Diagnosis not present

## 2017-06-17 DIAGNOSIS — Z1231 Encounter for screening mammogram for malignant neoplasm of breast: Secondary | ICD-10-CM | POA: Diagnosis not present

## 2017-06-17 DIAGNOSIS — Z8719 Personal history of other diseases of the digestive system: Secondary | ICD-10-CM | POA: Diagnosis not present

## 2017-06-17 DIAGNOSIS — C210 Malignant neoplasm of anus, unspecified: Secondary | ICD-10-CM | POA: Diagnosis not present

## 2017-06-17 DIAGNOSIS — Z9221 Personal history of antineoplastic chemotherapy: Secondary | ICD-10-CM | POA: Diagnosis not present

## 2017-06-17 DIAGNOSIS — Z85038 Personal history of other malignant neoplasm of large intestine: Secondary | ICD-10-CM | POA: Diagnosis not present

## 2017-06-18 ENCOUNTER — Other Ambulatory Visit: Payer: Self-pay

## 2017-06-18 ENCOUNTER — Encounter: Payer: Self-pay | Admitting: Gastroenterology

## 2017-06-18 ENCOUNTER — Ambulatory Visit (INDEPENDENT_AMBULATORY_CARE_PROVIDER_SITE_OTHER): Payer: 59 | Admitting: Gastroenterology

## 2017-06-18 VITALS — BP 119/64 | HR 73 | Temp 98.2°F | Ht 65.0 in | Wt 162.8 lb

## 2017-06-18 DIAGNOSIS — Z85038 Personal history of other malignant neoplasm of large intestine: Secondary | ICD-10-CM

## 2017-06-18 DIAGNOSIS — K279 Peptic ulcer, site unspecified, unspecified as acute or chronic, without hemorrhage or perforation: Secondary | ICD-10-CM

## 2017-06-18 DIAGNOSIS — Z85048 Personal history of other malignant neoplasm of rectum, rectosigmoid junction, and anus: Secondary | ICD-10-CM

## 2017-06-18 MED ORDER — NA SULFATE-K SULFATE-MG SULF 17.5-3.13-1.6 GM/177ML PO SOLN: 1.0000 | ORAL | 0 refills | Status: DC

## 2017-06-18 NOTE — Patient Instructions (Signed)
Continue the Protonix twice a day, 30 minutes before breakfast and dinner.  We have scheduled you for a colonoscopy and upper endoscopy with Dr. Oneida Alar.  Have a great birthday!  It was a pleasure to see you today. I strive to create trusting relationships with patients to provide genuine, compassionate, and quality care. I value your feedback. If you receive a survey regarding your visit,  I greatly appreciate you the taking time to fill this out.   Annitta Needs, PhD, ANP-BC [facility]

## 2017-06-18 NOTE — Progress Notes (Addendum)
REVIEWED-NO ADDITIONAL RECOMMENDATIONS.     Referring Provider: Redmond School, MD Primary Care Physician:  Redmond School, MD  Primary GI: Dr. Oneida Alar   Chief Complaint  Patient presents with  . Follow-up    repeat EGD. Dysphagia occas    HPI:   Lori Martinez is a 67 y.o. female presenting today with a history anal adenocarcinoma in 1991, sigmoid colon carcinoma in 1992. Anal adenocarcinoma treated with resection by Dr. Lanny Cramp, follow by chemo and radiation. Sigmoid carcinoma s/p resection followed by adjuvant chemotherapy with 5-FU and leucovorin and Levamisole, completing therapy in 1992. Saw Oncology at [facility] Jan 2019. Those notes state patient [place] have had a colonoscopy last year by Dr. Collene Mares. However, patient tells [place] she believes it was 2013. She was admitted to [facility] in Nov 2018 with hematemesis in setting of NSAIDs. EGD Nov 2018 with one non-bleeding cratered gastric ulcer with pigmented material in gastric body and on greater curvature of stomach, s/p epi injection and clips X 3. Admitting Hgb 8.0 and received 2 units PRBCs. Most recent Hgb 12.6 at [facility] Jun 17, 2017.    Will take an Advil or Ibuprofen "only if I have to" secondary to back pain. 1-2 times per week but tries not to. Protonix BID. No abdominal pain. No overt GI bleeding. Patient states she has lost weight, but she believes it is secondary to dietary changes. "Trying to watch what I eat". Review of weights in epic show she weighed about 190 in April 2017. Today 162.       Past Medical History:  Diagnosis Date  . Acid reflux   . Anal cancer ([place]) 1991  . Arthritis    hands and knees  . Cancer ([place])   . Cancer of sigmoid ([place]) 1992  . Cataract   . Change in voice   . Chest pain    seen 2010 (Dr. Elisabeth Cara, [facility]) and had low risk stress test, EF 76%.  . CHF (congestive heart failure) ([place])   . Chronic cough   . Diabetes mellitus   . Frequent urination   . Headaches, cluster   . Hearing loss   .  Hyperlipidemia   . Hypertension   . Neuropathy, peripheral   . Pneumonia    as a teenager  . Rotator cuff tear arthropathy    left  . Trouble swallowing   . Vomiting    as result of acid reflux   . Wears glasses     Past Surgical History:  Procedure Laterality Date  . ABDOMINAL HYSTERECTOMY    . BACK SURGERY  2010  . CHOLECYSTECTOMY    . COLON SURGERY  1991, 1992   colon/rectal cancer  . COLONOSCOPY  2013   By Dr. Collene Mares  . ESOPHAGOGASTRODUODENOSCOPY N/A 04/14/2017   One non-bleeding cratered ulcer with pigmented material in gastric body and on greater curvature of stomach, s/p epi injection and clips X 3. Chronic gastritis   . EYE SURGERY    . HERNIA REPAIR     hiatal hernia   . LAPAROSCOPIC NISSEN FUNDOPLICATION  0/10/27  . REVERSE SHOULDER ARTHROPLASTY Left 12/29/2015   Procedure: REVERSE SHOULDER ARTHROPLASTY;  Surgeon: Tania Ade, MD;  Location: [facility];  Service: Orthopedics;  Laterality: Left;  Left reverse total shoulder    Current Outpatient Medications  Medication Sig Dispense Refill  . FLUoxetine (PROZAC) 10 MG capsule Take 10 mg by mouth daily.    . pantoprazole (PROTONIX) 40 MG tablet Take 1 tablet (40 mg total) 2 (  two) times daily before a meal by mouth. 60 tablet 0  . pregabalin (LYRICA) 75 MG capsule Take 75 mg by mouth 2 (two) times daily.    . ramipril (ALTACE) 10 MG capsule Take 10 mg by mouth 2 (two) times daily.    Marland Kitchen acetaminophen (TYLENOL) 500 MG tablet Take 1,000 mg by mouth every 6 (six) hours as needed for moderate pain or headache.    . Na Sulfate-K Sulfate-Mg Sulf (SUPREP BOWEL PREP KIT) 17.5-3.13-1.6 GM/177ML SOLN Take 1 kit by mouth as directed. 1 Bottle 0   No current facility-administered medications for this visit.     Allergies as of 06/18/2017 - Review Complete 06/18/2017  Allergen Reaction Noted  . Penicillins Rash and Other (See Comments) 05/26/2008  . Sulfonamide derivatives Hives and Other (See Comments) 05/26/2008    Family  History  Problem Relation Age of Onset  . Stroke Mother   . Heart disease Father   . Heart disease Brother     Social History   Socioeconomic History  . Marital status: Married    Spouse name: None  . Number of children: None  . Years of education: None  . Highest education level: None  Social Needs  . Financial resource strain: None  . Food insecurity - worry: None  . Food insecurity - inability: None  . Transportation needs - medical: None  . Transportation needs - non-medical: None  Occupational History  . None  Tobacco Use  . Smoking status: Never Smoker  . Smokeless tobacco: Never Used  Substance and Sexual Activity  . Alcohol use: No  . Drug use: No  . Sexual activity: None  Other Topics Concern  . None  Social History Narrative  . None    Review of Systems: Gen: see HPI  CV: Denies chest pain, palpitations, syncope, peripheral edema, and claudication. Resp: Denies dyspnea at rest, cough, wheezing, coughing up blood, and pleurisy. GI: see HPI  Derm: Denies rash, itching, dry skin Psych: Denies depression, anxiety, memory loss, confusion. No homicidal or suicidal ideation.  Heme: Denies bruising, bleeding, and enlarged lymph nodes.  Physical Exam: BP 119/64   Pulse 73   Temp 98.2 F (36.8 C) (Oral)   Ht '5\' 5"'$  (1.651 m)   Wt 162 lb 12.8 oz (73.8 kg)   BMI 27.09 kg/m  General:   Alert and oriented. No distress noted. Pleasant and cooperative.  Head:  Normocephalic and atraumatic. Eyes:  Conjuctiva clear without scleral icterus. Mouth:  Oral mucosa pink and moist.  Abdomen:  +BS, soft, non-tender and non-distended. No rebound or guarding. No HSM or masses noted. Msk:  Symmetrical without gross deformities. Normal posture. Extremities:  Without edema. Neurologic:  Alert and  oriented x4 Psych:  Alert and cooperative. Normal mood and affect.

## 2017-06-21 ENCOUNTER — Encounter: Payer: Self-pay | Admitting: Gastroenterology

## 2017-06-22 ENCOUNTER — Telehealth: Payer: Self-pay | Admitting: Gastroenterology

## 2017-06-22 ENCOUNTER — Encounter: Payer: Self-pay | Admitting: Gastroenterology

## 2017-06-22 NOTE — Telephone Encounter (Signed)
Lori Martinez: can we contact Dr. Lorie Apley office in [place] and find out exactly when patient had her last colonoscopy? There is some confusion as to if it was in 2013 or 2018. We have requested records.   [facility] clinical pool: there might be a chance that colonoscopy needs to be cancelled but continue with EGD on 1/18. Is this going to be an issue?

## 2017-06-22 NOTE — Assessment & Plan Note (Addendum)
From notes by Oncology at [facility] Jan 2019, patient has interesting history of anal adenocarcinoma in 1991 and sigmoid colon carcinoma 1992. At time of this visit, she believes her last colonoscopy was in 2013 with Dr. Collene Mares. However, she [place] have had this more recently after review of [facility]'s notes. At time of visit, I had scheduled her for a colonoscopy as she was due for surveillance. I have requested records ASAP. If colonoscopy was in 2013, will pursue surveillance at time of EGD. If it is not time, we will need to postpone this. As of note, patient desires to continue care with [facility] instead of in [place] for transportation reasons.   ADDENDUM on 1/16: Colonoscopy completed [place] 2018 by Dr. Collene Mares: healthy anastomosis at 10 cm, rest of colonic mucosa appeared normal, retroflexion not possible due to limited rectal compliance. Repeat in 5 years.  No colonoscopy indicated at this time.

## 2017-06-22 NOTE — Assessment & Plan Note (Signed)
67 year old female admitted with UGI bleed in Nov 2018 secondary to PUD in setting of NSAIDs. EGD with one non-bleeding cratered gastric ulcer with pigmented material in gastric body and greater curvature of stomach, s/p epi injection and clips X 3. 2 units PRBCs during admission. Outside labs from [facility] recently with anemia resolved, Hgb in 12 range. She continues to take Advil or Ibuprofen 1-2 times per week but tries to limit this. Protonix BID. Discussed avoidance of NSAIDs with patient extensively. She needs a surveillance EGD now.  Proceed with upper endoscopy in the near future with Dr. Oneida Alar. The risks, benefits, and alternatives have been discussed in detail with patient. They have stated understanding and desire to proceed.  Continue Protonix BID Avoid NSAIDs

## 2017-06-24 NOTE — Progress Notes (Signed)
CC'D TO PCP °

## 2017-06-24 NOTE — Patient Instructions (Signed)
Kimmie at [facility]-service center called office. She stated [employer] is pt's primary insurance, Medicare is secondary. Upcoming TCS/EGD needs PA. PA info submitted via [facility] website. Case approved. PA# [id number]. Called and informed Kimmie.

## 2017-06-24 NOTE — Telephone Encounter (Signed)
No it won't. We [place] just have to redo the orders but it won't be a problem.

## 2017-06-24 NOTE — Telephone Encounter (Signed)
Called Dr. Lorie Apley office and was advised last TCS was 08/2016. They are going to fax records over.

## 2017-06-24 NOTE — Telephone Encounter (Signed)
Can we find out from Dr. Lorie Apley office in [place] when last colonoscopy was completed?

## 2017-06-25 ENCOUNTER — Encounter: Payer: Self-pay | Admitting: *Deleted

## 2017-06-25 ENCOUNTER — Other Ambulatory Visit: Payer: Self-pay | Admitting: *Deleted

## 2017-06-25 DIAGNOSIS — K279 Peptic ulcer, site unspecified, unspecified as acute or chronic, without hemorrhage or perforation: Secondary | ICD-10-CM

## 2017-06-25 NOTE — Telephone Encounter (Signed)
New orders placed for EGD only

## 2017-06-25 NOTE — Telephone Encounter (Signed)
Patient called back and is aware of plan. Discussed EGD directions with pt. She verbalized understanding and had no further questions.

## 2017-06-25 NOTE — Telephone Encounter (Signed)
Still have not received records. As colonoscopy was March 2018, does not need colonoscopy this week, only EGD as planned.

## 2017-06-25 NOTE — Telephone Encounter (Signed)
LMOVM to make pt aware only needs EGD and discuss instructions for this with her.  Called Hoyle Sauer and LMOVM

## 2017-06-26 ENCOUNTER — Encounter: Payer: Self-pay | Admitting: Gastroenterology

## 2017-06-27 MED ORDER — SODIUM CHLORIDE 0.9 % IV SOLN: INTRAVENOUS | Status: DC

## 2017-06-28 ENCOUNTER — Other Ambulatory Visit: Payer: Self-pay

## 2017-06-28 ENCOUNTER — Encounter (HOSPITAL_COMMUNITY): Payer: Self-pay | Admitting: Gastroenterology

## 2017-06-28 ENCOUNTER — Encounter (HOSPITAL_COMMUNITY): Admission: RE | Payer: Self-pay | Source: Ambulatory Visit

## 2017-06-28 ENCOUNTER — Encounter (HOSPITAL_COMMUNITY)
Admission: RE | Disposition: A | Discharge status: Home / Self Care | Payer: Self-pay | Source: Ambulatory Visit | Attending: Gastroenterology

## 2017-06-28 ENCOUNTER — Ambulatory Visit (HOSPITAL_COMMUNITY)
Admission: RE | Admit: 2017-06-28 | Discharge: 2017-06-28 | Disposition: A | Discharge status: Home / Self Care | Payer: 59 | Source: Ambulatory Visit | Attending: Gastroenterology | Admitting: Gastroenterology

## 2017-06-28 ENCOUNTER — Ambulatory Visit (HOSPITAL_COMMUNITY): Admission: RE | Admit: 2017-06-28 | Payer: 59 | Source: Ambulatory Visit | Admitting: Gastroenterology

## 2017-06-28 DIAGNOSIS — I110 Hypertensive heart disease with heart failure: Secondary | ICD-10-CM | POA: Insufficient documentation

## 2017-06-28 DIAGNOSIS — Z880 Allergy status to penicillin: Secondary | ICD-10-CM | POA: Diagnosis not present

## 2017-06-28 DIAGNOSIS — Z85038 Personal history of other malignant neoplasm of large intestine: Secondary | ICD-10-CM

## 2017-06-28 DIAGNOSIS — Z8249 Family history of ischemic heart disease and other diseases of the circulatory system: Secondary | ICD-10-CM | POA: Insufficient documentation

## 2017-06-28 DIAGNOSIS — M19041 Primary osteoarthritis, right hand: Secondary | ICD-10-CM | POA: Diagnosis not present

## 2017-06-28 DIAGNOSIS — Z85048 Personal history of other malignant neoplasm of rectum, rectosigmoid junction, and anus: Secondary | ICD-10-CM | POA: Insufficient documentation

## 2017-06-28 DIAGNOSIS — Z79899 Other long term (current) drug therapy: Secondary | ICD-10-CM | POA: Insufficient documentation

## 2017-06-28 DIAGNOSIS — I509 Heart failure, unspecified: Secondary | ICD-10-CM | POA: Diagnosis not present

## 2017-06-28 DIAGNOSIS — E785 Hyperlipidemia, unspecified: Secondary | ICD-10-CM | POA: Diagnosis not present

## 2017-06-28 DIAGNOSIS — M170 Bilateral primary osteoarthritis of knee: Secondary | ICD-10-CM | POA: Diagnosis not present

## 2017-06-28 DIAGNOSIS — M19042 Primary osteoarthritis, left hand: Secondary | ICD-10-CM | POA: Diagnosis not present

## 2017-06-28 DIAGNOSIS — E1140 Type 2 diabetes mellitus with diabetic neuropathy, unspecified: Secondary | ICD-10-CM | POA: Diagnosis not present

## 2017-06-28 DIAGNOSIS — Z882 Allergy status to sulfonamides status: Secondary | ICD-10-CM | POA: Insufficient documentation

## 2017-06-28 DIAGNOSIS — K219 Gastro-esophageal reflux disease without esophagitis: Secondary | ICD-10-CM | POA: Insufficient documentation

## 2017-06-28 DIAGNOSIS — Z09 Encounter for follow-up examination after completed treatment for conditions other than malignant neoplasm: Secondary | ICD-10-CM | POA: Insufficient documentation

## 2017-06-28 DIAGNOSIS — K279 Peptic ulcer, site unspecified, unspecified as acute or chronic, without hemorrhage or perforation: Secondary | ICD-10-CM | POA: Diagnosis not present

## 2017-06-28 DIAGNOSIS — Z8711 Personal history of peptic ulcer disease: Secondary | ICD-10-CM | POA: Insufficient documentation

## 2017-06-28 HISTORY — PX: ESOPHAGOGASTRODUODENOSCOPY: SHX5428

## 2017-06-28 LAB — GLUCOSE, CAPILLARY: Glucose-Capillary: 82 mg/dL (ref 65–99)

## 2017-06-28 SURGERY — EGD (ESOPHAGOGASTRODUODENOSCOPY)
Anesthesia: Moderate Sedation

## 2017-06-28 SURGERY — COLONOSCOPY
Anesthesia: Moderate Sedation

## 2017-06-28 MED ORDER — ONDANSETRON HCL 4 MG/2ML IJ SOLN
INTRAMUSCULAR | Status: DC | PRN
  Administered 2017-06-28: 4 mg via INTRAVENOUS

## 2017-06-28 MED ORDER — MIDAZOLAM HCL 5 MG/5ML IJ SOLN
INTRAMUSCULAR | Status: DC | PRN
  Administered 2017-06-28 (×2): 2 mg via INTRAVENOUS

## 2017-06-28 MED ORDER — LIDOCAINE VISCOUS 2 % MT SOLN
OROMUCOSAL | Status: AC
  Filled 2017-06-28: qty 15

## 2017-06-28 MED ORDER — ONDANSETRON HCL 4 MG/2ML IJ SOLN
INTRAMUSCULAR | Status: DC
Start: 2017-06-28 — End: 2017-06-28
  Filled 2017-06-28: qty 2

## 2017-06-28 MED ORDER — MIDAZOLAM HCL 5 MG/5ML IJ SOLN
INTRAMUSCULAR | Status: AC
  Filled 2017-06-28: qty 10

## 2017-06-28 MED ORDER — MEPERIDINE HCL 100 MG/ML IJ SOLN
INTRAMUSCULAR | Status: DC | PRN
  Administered 2017-06-28 (×2): 25 mg

## 2017-06-28 MED ORDER — SODIUM CHLORIDE 0.9 % IV SOLN
INTRAVENOUS | Status: DC
  Administered 2017-06-28: 13:00:00 via INTRAVENOUS

## 2017-06-28 MED ORDER — MEPERIDINE HCL 100 MG/ML IJ SOLN
INTRAMUSCULAR | Status: AC
  Filled 2017-06-28: qty 2

## 2017-06-28 MED ORDER — LIDOCAINE VISCOUS 2 % MT SOLN
OROMUCOSAL | Status: DC | PRN
  Administered 2017-06-28: 1 via OROMUCOSAL

## 2017-06-28 NOTE — Discharge Instructions (Signed)
Your ULCER IS healed. YOUR SMALL BOWEL LOOKED NORMAL.   USE NAPROXEN OR IBUPROFEN SPARINGLY.  DRINK WATER TO KEEP YOUR URINE LIGHT YELLOW.  FOLLOW A LOW FAT/HIGH FIBER DIET. MEATS SHOULD BE BAKED, BROILED, OR BOILED. AVOID FRIED FOODS. SEE INFO BELOW ON A LOW FAT DIET.  CONTINUE PROTONIX. TAKE 30 MINUTES PRIOR TO FIRST MEAL DAILY.  PLEASE CALL WITH QUESTIONS OR CONCERNS.  FOLLOW UP IN ONE YEAR.   UPPER ENDOSCOPY AFTER CARE Read the instructions outlined below and refer to this sheet in the next week. These discharge instructions provide you with general information on caring for yourself after you leave the hospital. While your treatment has been planned according to the most current medical practices available, unavoidable complications occasionally occur. If you have any problems or questions after discharge, call DR. Ardis Fullwood, [phone].  ACTIVITY  You Kamm resume your regular activity, but move at a slower pace for the next 24 hours.   Take frequent rest periods for the next 24 hours.   Walking will help get rid of the air and reduce the bloated feeling in your belly (abdomen).   No driving for 24 hours (because of the medicine (anesthesia) used during the test).   You Henneke shower.   Do not sign any important legal documents or operate any machinery for 24 hours (because of the anesthesia used during the test).    NUTRITION  Drink plenty of fluids.   You Furnish resume your normal diet as instructed by your doctor.   Begin with a light meal and progress to your normal diet. Heavy or fried foods are harder to digest and [place] make you feel sick to your stomach (nauseated).   Avoid alcoholic beverages for 24 hours or as instructed.    MEDICATIONS  You Uncapher resume your normal medications.   WHAT YOU CAN EXPECT TODAY  Some feelings of bloating in the abdomen.   Passage of more gas than usual.    IF YOU HAD A BIOPSY TAKEN DURING THE UPPER ENDOSCOPY:  Eat a soft diet  IF YOU HAVE NAUSEA, BLOATING, ABDOMINAL PAIN, OR VOMITING.    FINDING OUT THE RESULTS OF YOUR TEST Not all test results are available during your visit. DR. Oneida Alar WILL CALL YOU WITHIN 14 DAYS OF YOUR PROCEDUE WITH YOUR RESULTS. Do not assume everything is normal if you have not heard from DR. Amaiya Scruton, CALL HER OFFICE AT [phone].  SEEK IMMEDIATE MEDICAL ATTENTION AND CALL THE OFFICE: [phone] IF:  You have more than a spotting of blood in your stool.   Your belly is swollen (abdominal distention).   You are nauseated or vomiting.   You have a temperature over 101F.   You have abdominal pain or discomfort that is severe or gets worse throughout the day.     Low-Fat Diet  BREADS, CEREALS, PASTA, RICE, DRIED PEAS, AND BEANS These products are high in carbohydrates and most are low in fat. Therefore, they can be increased in the diet as substitutes for fatty foods. They too, however, contain calories and should not be eaten in excess. Cereals can be eaten for snacks as well as for breakfast.  Include foods that contain fiber (fruits, vegetables, whole grains, and legumes). Research shows that fiber [place] lower blood cholesterol levels, especially the water-soluble fiber found in fruits, vegetables, oat products, and legumes.  FRUITS AND VEGETABLES It is good to eat fruits and vegetables. Besides being sources of fiber, both are rich in vitamins and some minerals. They  help you get the daily allowances of these nutrients. Fruits and vegetables can be used for snacks and desserts.  MEATS Limit lean meat, chicken, [place], and fish to no more than 6 ounces per day.  Beef, Pork, and Lamb Use lean cuts of beef, pork, and lamb. Lean cuts include:  Extra-lean ground beef.  Arm roast.  Sirloin tip.  Center-cut ham.  Round steak.  Loin chops.  Rump roast.  Tenderloin.  Trim all fat off the outside of meats before cooking. It is not necessary to severely decrease the intake of red  meat, but lean choices should be made. Lean meat is rich in protein and contains a highly absorbable form of iron. Premenopausal women, in particular, should avoid reducing lean red meat because this could increase the risk for low red blood cells (iron-deficiency anemia).  Chicken and [place] These are good sources of protein. The fat of poultry can be reduced by removing the skin and underlying fat layers before cooking. Chicken and [place] can be substituted for lean red meat in the diet. Poultry should not be fried or covered with high-fat sauces.  Fish and Shellfish Fish is a good source of protein. Shellfish contain cholesterol, but they usually are low in saturated fatty acids. The preparation of fish is important. Like chicken and [place], they should not be fried or covered with high-fat sauces.  EGGS Egg whites contain no fat or cholesterol. They can be eaten often. Try 1 to 2 egg whites instead of whole eggs in recipes or use egg substitutes that do not contain yolk.  MILK AND DAIRY PRODUCTS Use skim or 1% milk instead of 2% or whole milk. Decrease whole milk, natural, and processed cheeses. Use nonfat or low-fat (2%) cottage cheese or low-fat cheeses made from vegetable oils. Choose nonfat or low-fat (1 to 2%) yogurt. Experiment with evaporated skim milk in recipes that call for heavy cream. Substitute low-fat yogurt or low-fat cottage cheese for sour cream in dips and salad dressings. Have at least 2 servings of low-fat dairy products, such as 2 glasses of skim (or 1%) milk each day to help get your daily calcium intake.  FATS AND OILS Reduce the total intake of fats, especially saturated fat. Butterfat, lard, and beef fats are high in saturated fat and cholesterol. These should be avoided as much as possible. Vegetable fats do not contain cholesterol, but certain vegetable fats, such as coconut oil, palm oil, and palm kernel oil are very high in saturated fats. These should be limited.  These fats are often used in bakery goods, processed foods, popcorn, oils, and nondairy creamers. Vegetable shortenings and some peanut butters contain hydrogenated oils, which are also saturated fats. Read the labels on these foods and check for saturated vegetable oils.  Unsaturated vegetable oils and fats do not raise blood cholesterol. However, they should be limited because they are fats and are high in calories. Total fat should still be limited to 30% of your daily caloric intake. Desirable liquid vegetable oils are corn oil, cottonseed oil, olive oil, canola oil, safflower oil, soybean oil, and sunflower oil. Peanut oil is not as good, but small amounts are acceptable. Buy a heart-healthy tub margarine that has no partially hydrogenated oils in the ingredients. Mayonnaise and salad dressings often are made from unsaturated fats, but they should also be limited because of their high calorie and fat content. Seeds, nuts, peanut butter, olives, and avocados are high in fat, but the fat is mainly the unsaturated  type. These foods should be limited mainly to avoid excess calories and fat.  OTHER EATING TIPS Snacks  Most sweets should be limited as snacks. They tend to be rich in calories and fats, and their caloric content outweighs their nutritional value. Some good choices in snacks are graham crackers, melba toast, soda crackers, bagels (no egg), English muffins, fruits, and vegetables. These snacks are preferable to snack crackers, [place] fries, and chips. Popcorn should be air-popped or cooked in small amounts of liquid vegetable oil.  Desserts Eat fruit, low-fat yogurt, and fruit ices instead of pastries, cake, and cookies. Sherbet, angel food cake, gelatin dessert, frozen low-fat yogurt, or other frozen products that do not contain saturated fat (pure fruit juice bars, frozen ice pops) are also acceptable.   COOKING METHODS Choose those methods that use little or no fat. They  include: Poaching.  Braising.  Steaming.  Grilling.  Baking.  Stir-frying.  Broiling.  Microwaving.  Foods can be cooked in a nonstick pan without added fat, or use a nonfat cooking spray in regular cookware. Limit fried foods and avoid frying in saturated fat. Add moisture to lean meats by using water, broth, cooking wines, and other nonfat or low-fat sauces along with the cooking methods mentioned above. Soups and stews should be chilled after cooking. The fat that forms on top after a few hours in the refrigerator should be skimmed off. When preparing meals, avoid using excess salt. Salt can contribute to raising blood pressure in some people.  EATING AWAY FROM HOME Order entres, potatoes, and vegetables without sauces or butter. When meat exceeds the size of a deck of cards (3 to 4 ounces), the rest can be taken home for another meal. Choose vegetable or fruit salads and ask for low-calorie salad dressings to be served on the side. Use dressings sparingly. Limit high-fat toppings, such as bacon, crumbled eggs, cheese, sunflower seeds, and olives. Ask for heart-healthy tub margarine instead of butter.

## 2017-06-28 NOTE — Op Note (Signed)
[facility] Patient Name: Lori Martinez Procedure Date: 06/28/2017 1:25 PM MRN: [id number] Date of Birth: [birth year] Attending MD: Barney Drain MD, MD CSN: [id number] Age: 67 Admit Type: Outpatient Procedure:                Upper GI endoscopy, DIAGNOSTIC Indications:              Follow-up of peptic ulcer Providers:                Barney Drain MD, MD, Janeece Riggers, RN, Randa Spike, Technician, Nelma Rothman, Technician Referring MD:             Redmond School, MD Medicines:                Meperidine 50 mg IV, Midazolam 4 mg IV, Ondansetron                            4 mg IV Complications:            No immediate complications. Estimated Blood Loss:     Estimated blood loss: none. Procedure:                Pre-Anesthesia Assessment:                           - Prior to the procedure, a History and Physical                            was performed, and patient medications and                            allergies were reviewed. The patient's tolerance of                            previous anesthesia was also reviewed. The risks                            and benefits of the procedure and the sedation                            options and risks were discussed with the patient.                            All questions were answered, and informed consent                            was obtained. Prior Anticoagulants: The patient has                            taken no previous anticoagulant or antiplatelet                            agents. ASA Grade Assessment: II - A patient with  mild systemic disease. After reviewing the risks                            and benefits, the patient was deemed in                            satisfactory condition to undergo the procedure.                            After obtaining informed consent, the endoscope was                            passed under direct vision. Throughout the          procedure, the patient's blood pressure, pulse, and                            oxygen saturations were monitored continuously. The                            [id number] ([id number]) scope was introduced through the                            mouth, and advanced to the second part of duodenum.                            The upper GI endoscopy was accomplished without                            difficulty. The patient tolerated the procedure                            well. Scope In: 1:52:15 PM Scope Out: 1:55:19 PM Total Procedure Duration: 0 hours 3 minutes 4 seconds  Findings:      The examined esophagus was normal.      The entire examined stomach was normal.      The examined duodenum was normal. Impression:               - Normal esophagus.                           - Normal stomach.                           - Normal examined duodenum. Moderate Sedation:      Moderate (conscious) sedation was administered by the endoscopy nurse       and supervised by the endoscopist. The following parameters were       monitored: oxygen saturation, heart rate, blood pressure, and response       to care. Total physician intraservice time was 16 minutes. Recommendation:           - High fiber diet and low fat diet.                           - Continue present medications.                           -  Return to my office in 1 year.                           - Patient has a contact number available for                            emergencies. The signs and symptoms of potential                            delayed complications were discussed with the                            patient. Return to normal activities tomorrow.                            Written discharge instructions were provided to the                            patient. Procedure Code(s):        --- Professional ---                           [phone], Esophagogastroduodenoscopy, flexible,                            transoral;  diagnostic, including collection of                            specimen(s) by brushing or washing, when performed                            (separate procedure)                           99152, Moderate sedation services provided by the                            same physician or other qualified health care                            professional performing the diagnostic or                            therapeutic service that the sedation supports,                            requiring the presence of an independent trained                            observer to assist in the monitoring of the                            patient's level of consciousness and physiological  status; initial 15 minutes of intraservice time,                            patient age 42 years or older Diagnosis Code(s):        --- Professional ---                           K27.9, Peptic ulcer, site unspecified, unspecified                            as acute or chronic, without hemorrhage or                            perforation CPT copyright 2016 American Medical Association. All rights reserved. The codes documented in this report are preliminary and upon coder review Mikes  be revised to meet current compliance requirements. Barney Drain, MD Barney Drain MD, MD 06/28/2017 2:04:46 PM This report has been signed electronically. Number of Addenda: 0

## 2017-06-28 NOTE — H&P (Signed)
Primary Care Physician:  Redmond School, MD Primary Gastroenterologist:  Dr. Oneida Alar  Pre-Procedure History & Physical: HPI:  Lori Martinez is a 67 y.o. female here for PUD/EGD TO [place].   Past Medical History:  Diagnosis Date  . Acid reflux   . 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 Arthritis    hands and knees  . Cancer ([place])   . 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 Cataract   . Change in voice   . Chest pain    seen 2010 (Dr. Elisabeth Cara, [facility]) and had low risk stress test, EF 76%.  . CHF (congestive heart failure) ([place])   . Chronic cough   . Diabetes mellitus   . Frequent urination   . Headaches, cluster   . Hearing loss   . Hyperlipidemia   . Hypertension   . Neuropathy, peripheral   . Pneumonia    as a teenager  . Rotator cuff tear arthropathy    left  . Trouble swallowing   . Vomiting    as result of acid reflux   . Wears glasses     Past Surgical History:  Procedure Laterality Date  . ABDOMINAL HYSTERECTOMY    . BACK SURGERY  2010  . CHOLECYSTECTOMY    . COLON SURGERY  1991, 1992   colon/rectal cancer  . COLONOSCOPY  2013   By Dr. Collene Mares  . COLONOSCOPY  10/2016   Dr. Collene Mares: healthy anastomosis at 10 cm, rest of colonic mucosa appeared normal, retroflexion not possible due to limited rectal compliance. Repeat in 5 years  . ESOPHAGOGASTRODUODENOSCOPY N/A 04/14/2017   One non-bleeding cratered ulcer with pigmented material in gastric body and on greater curvature of stomach, s/p epi injection and clips X 3. Chronic gastritis   . EYE SURGERY    . HERNIA REPAIR     hiatal hernia   . LAPAROSCOPIC NISSEN FUNDOPLICATION  6/38/45  . REVERSE SHOULDER ARTHROPLASTY Left 12/29/2015   Procedure: REVERSE SHOULDER ARTHROPLASTY;  Surgeon: Tania Ade, MD;  Location: [facility];  Service: Orthopedics;  Laterality: Left;  Left reverse total shoulder    Prior to Admission medications   Medication Sig Start Date End Date Taking? Authorizing Provider  acetaminophen (TYLENOL)  500 MG tablet Take 1,000 mg by mouth every 6 (six) hours as needed for moderate pain or headache.   Yes [provider]  FLUoxetine (PROZAC) 10 MG capsule Take 10 mg by mouth daily.   Yes [provider]  Na Sulfate-K Sulfate-Mg Sulf (SUPREP BOWEL PREP KIT) 17.5-3.13-1.6 GM/177ML SOLN Take 1 kit by mouth as directed. 06/18/17  Yes Jaben Benegas L, MD  pantoprazole (PROTONIX) 40 MG tablet Take 1 tablet (40 mg total) 2 (two) times daily before a meal by mouth. 04/16/17  Yes Eber Jones, MD  pregabalin (LYRICA) 75 MG capsule Take 75 mg by mouth 2 (two) times daily.   Yes [provider]  ramipril (ALTACE) 10 MG capsule Take 10 mg by mouth 2 (two) times daily.   Yes [provider]    Allergies as of 06/25/2017 - Review Complete 06/19/2017  Allergen Reaction Noted  . Penicillins Hives, Rash, and Other (See Comments) 05/26/2008  . Sulfonamide derivatives Hives and Other (See Comments) 05/26/2008    Family History  Problem Relation Age of Onset  . Stroke Mother   . Heart disease Father   . Heart disease Brother     Social History   Socioeconomic History  . Marital status: Married  Spouse name: Not on file  . Number of children: Not on file  . Years of education: Not on file  . Highest education level: Not on file  Social Needs  . Financial resource strain: Not on file  . Food insecurity - worry: Not on file  . Food insecurity - inability: Not on file  . Transportation needs - medical: Not on file  . Transportation needs - non-medical: Not on file  Occupational History  . Not on file  Tobacco Use  . Smoking status: Never Smoker  . Smokeless tobacco: Never Used  Substance and Sexual Activity  . Alcohol use: No  . Drug use: No  . Sexual activity: Not on file  Other Topics Concern  . Not on file  Social History Narrative  . Not on file    Review of Systems: See HPI, otherwise negative ROS   Physical Exam: BP (!) 113/52   Pulse  76   Temp 98.6 F (37 C) (Oral)   Resp 17   Ht '5\' 5"'$  (1.651 m)   Wt 162 lb (73.5 kg)   SpO2 99%   BMI 26.96 kg/m  General:   Alert,  pleasant and cooperative in NAD Head:  Normocephalic and atraumatic. Neck:  Supple; Lungs:  Clear throughout to auscultation.    Heart:  Regular rate and rhythm. Abdomen:  Soft, nontender and nondistended. Normal bowel sounds, without guarding, and without rebound.   Neurologic:  Alert and  oriented x4;  grossly normal neurologically.  Impression/Plan:     PUD  PLAN:  REPEAT EGD TO CONFIRM ULCERS ARE HEALED.

## 2017-07-01 ENCOUNTER — Encounter (HOSPITAL_COMMUNITY): Payer: Self-pay | Admitting: Gastroenterology

## 2017-08-24 IMAGING — CR DG CHEST 2V
2 series · 2 of 2 positions shown · non-contrast
Comparison: 04/14/2014

CLINICAL DATA: Preoperative evaluation for shoulder replacement
surgery, history CHF, diabetes mellitus, hypertension

EXAM:
CHEST  2 VIEW

[w chest pa]
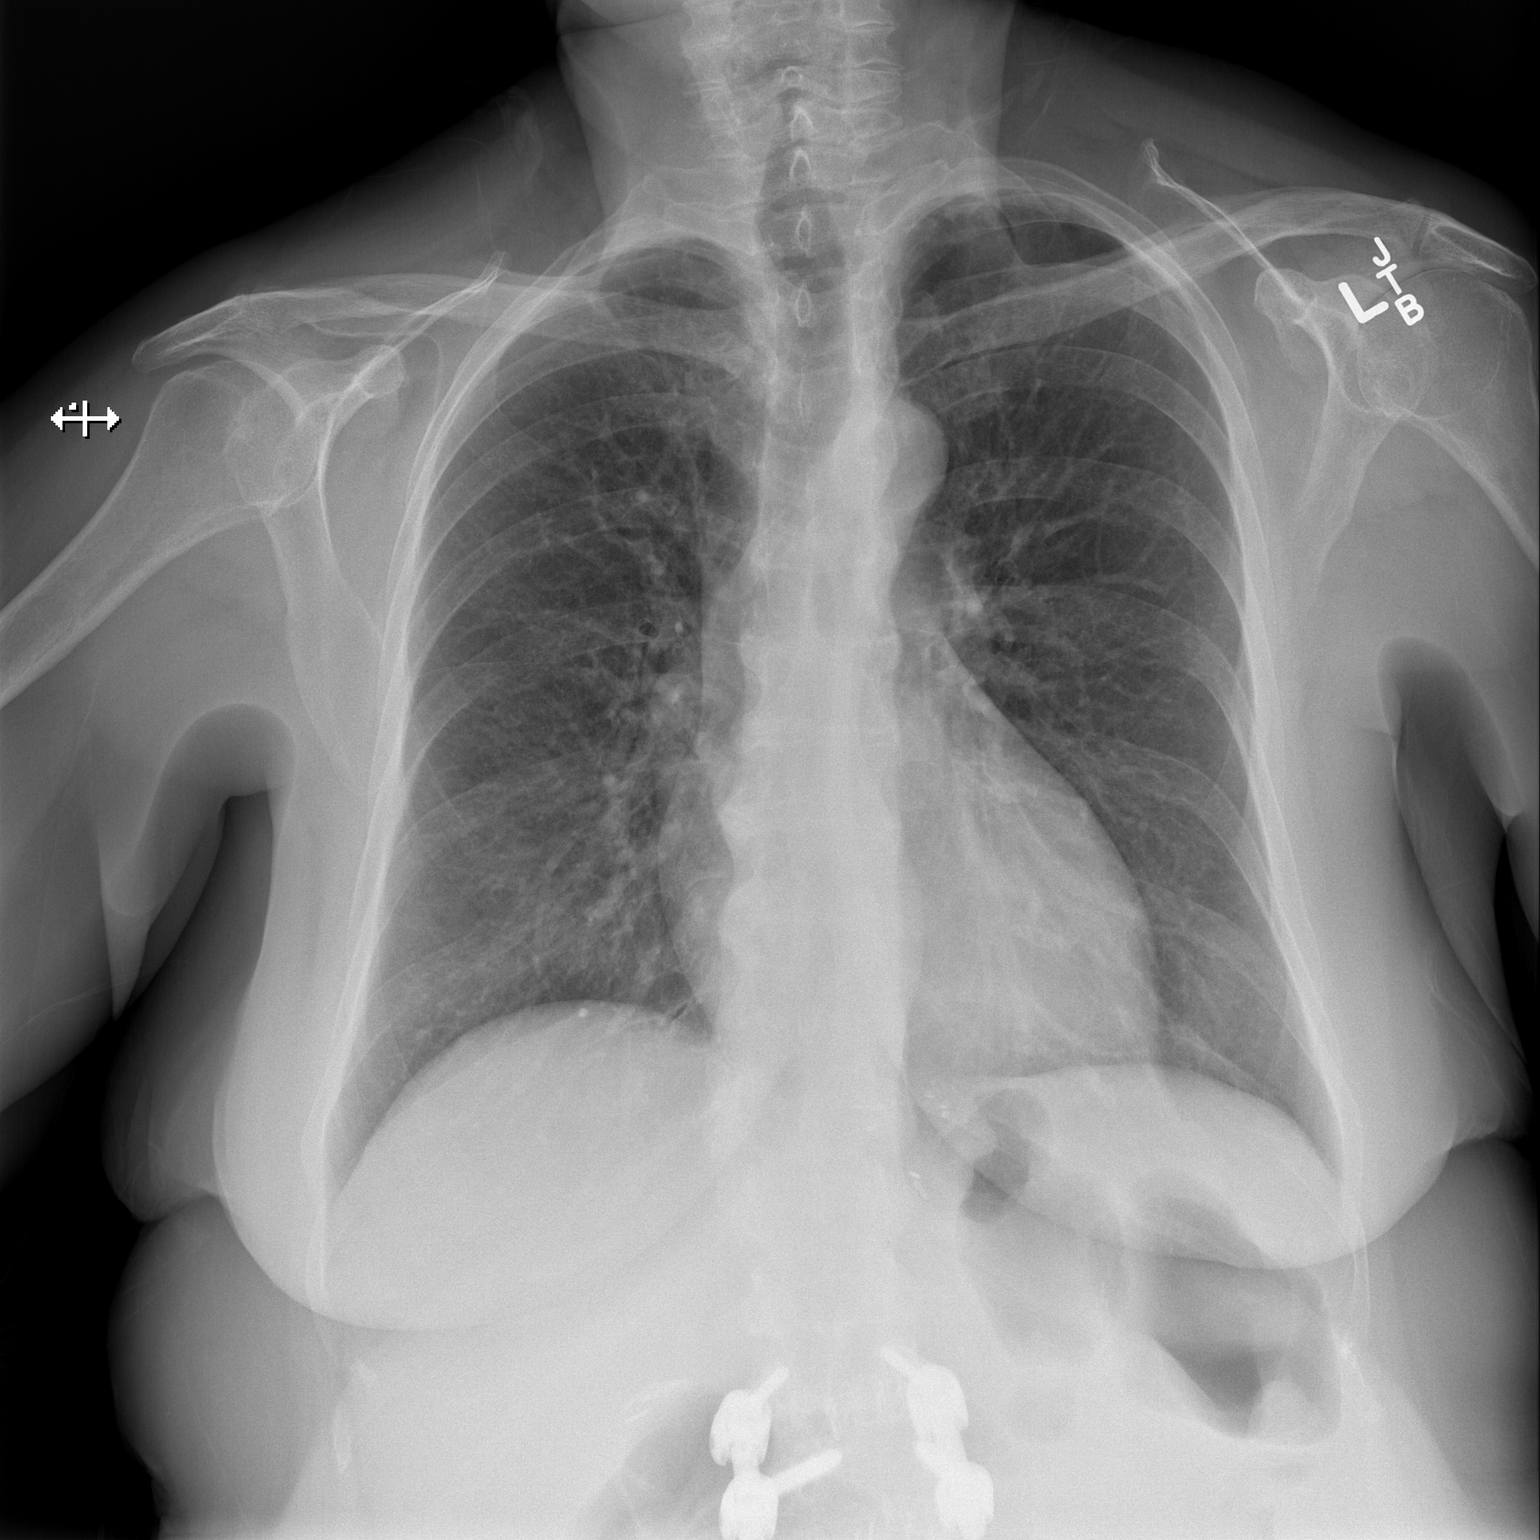

[w chest lat]
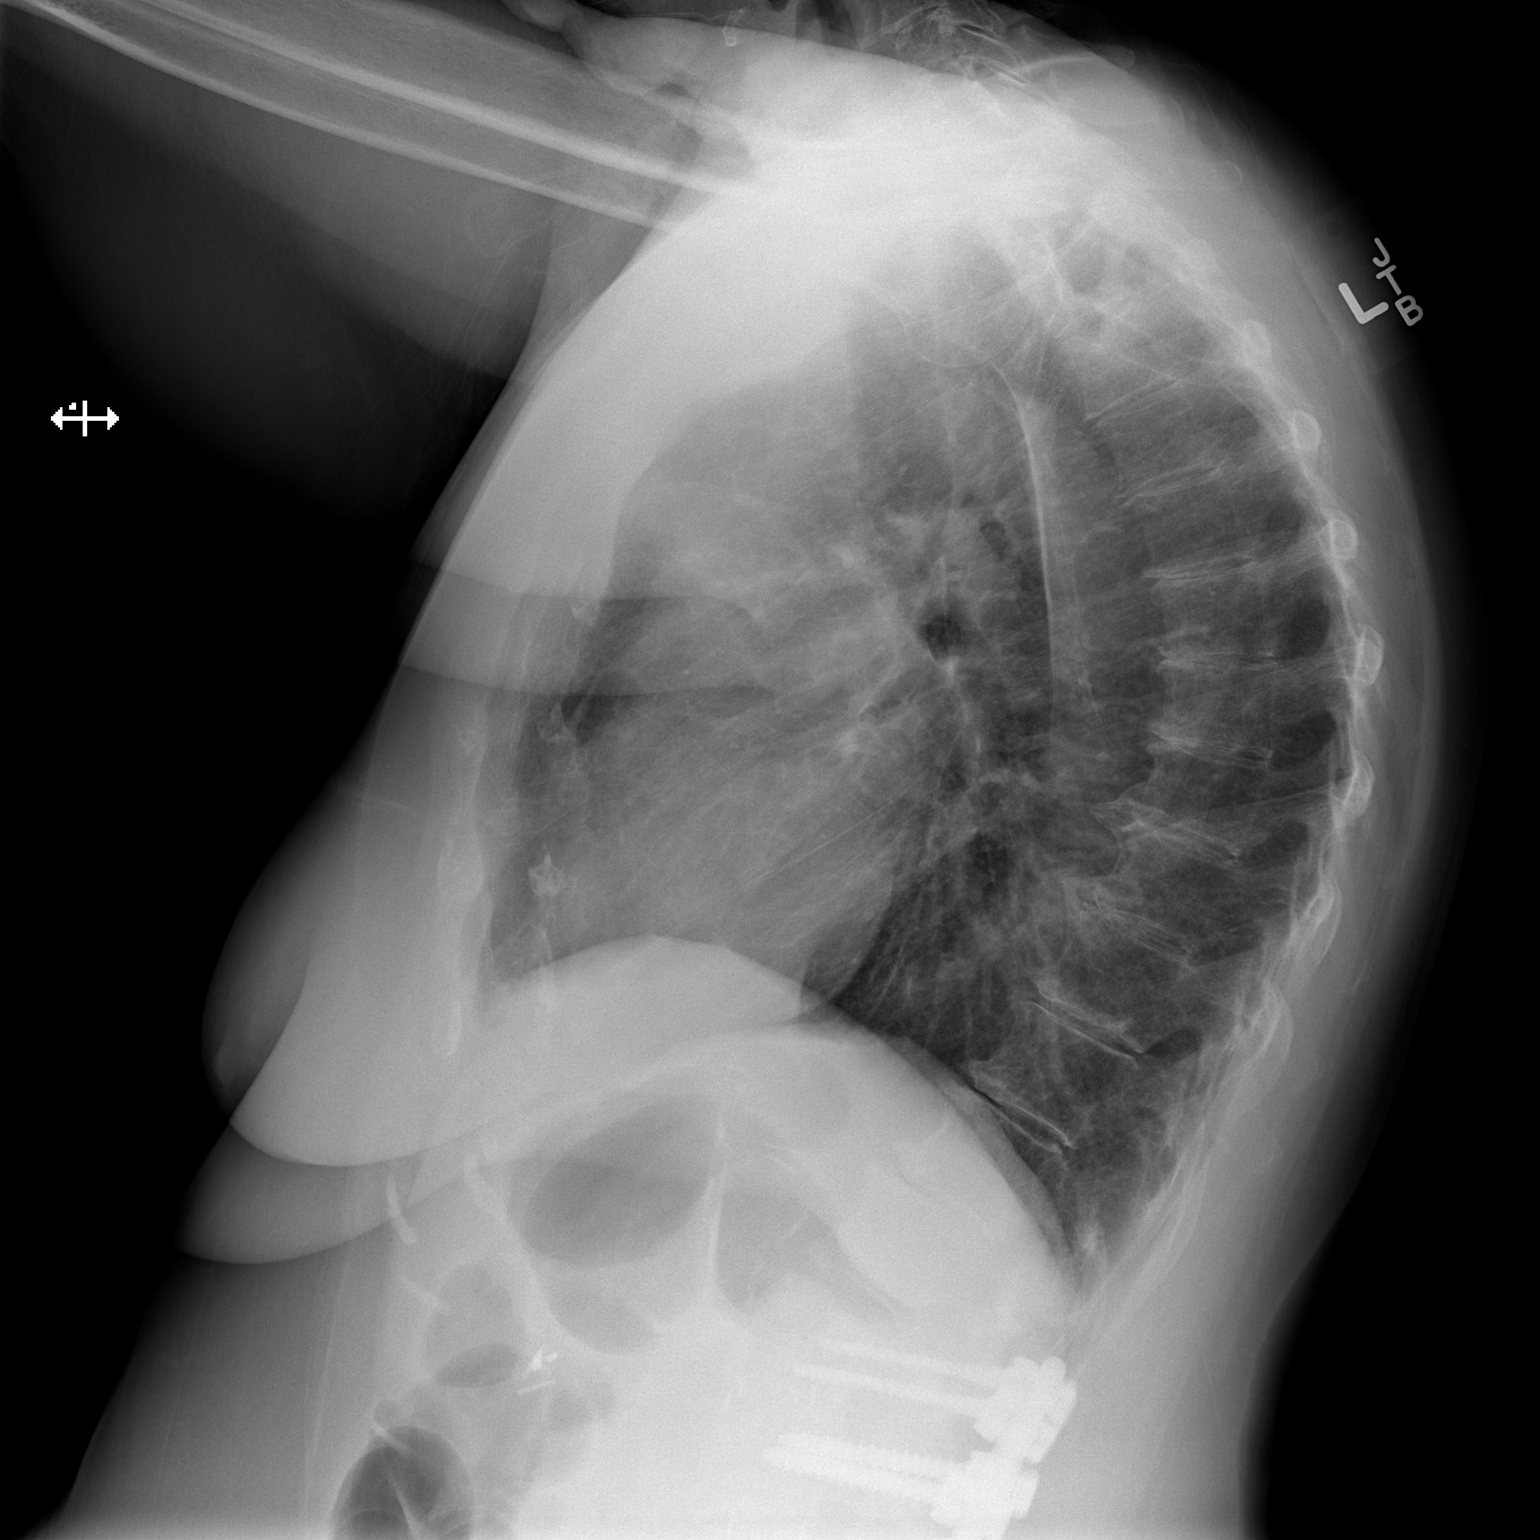

[2 of 2 positions shown; findings below may reference images not displayed]

FINDINGS: Upper normal size of cardiac silhouette.

Mediastinal contours and pulmonary vascularity normal.

Bronchitic changes without pulmonary infiltrate, pleural effusion or
pneumothorax.

Scattered degenerative disc disease changes thoracic spine with
evidence of prior lumbar fusion.

Osseous demineralization.
IMPRESSION: Bronchitic changes without infiltrate.

## 2017-08-31 IMAGING — DX DG SHOULDER 1V*L*
1 series · 1 of 1 positions shown · non-contrast
Comparison: Left shoulder films of 07/08/2014

CLINICAL DATA: Post reverse left total shoulder arthroplasty

EXAM:
LEFT SHOULDER - 1 VIEW

[shoulder ap]
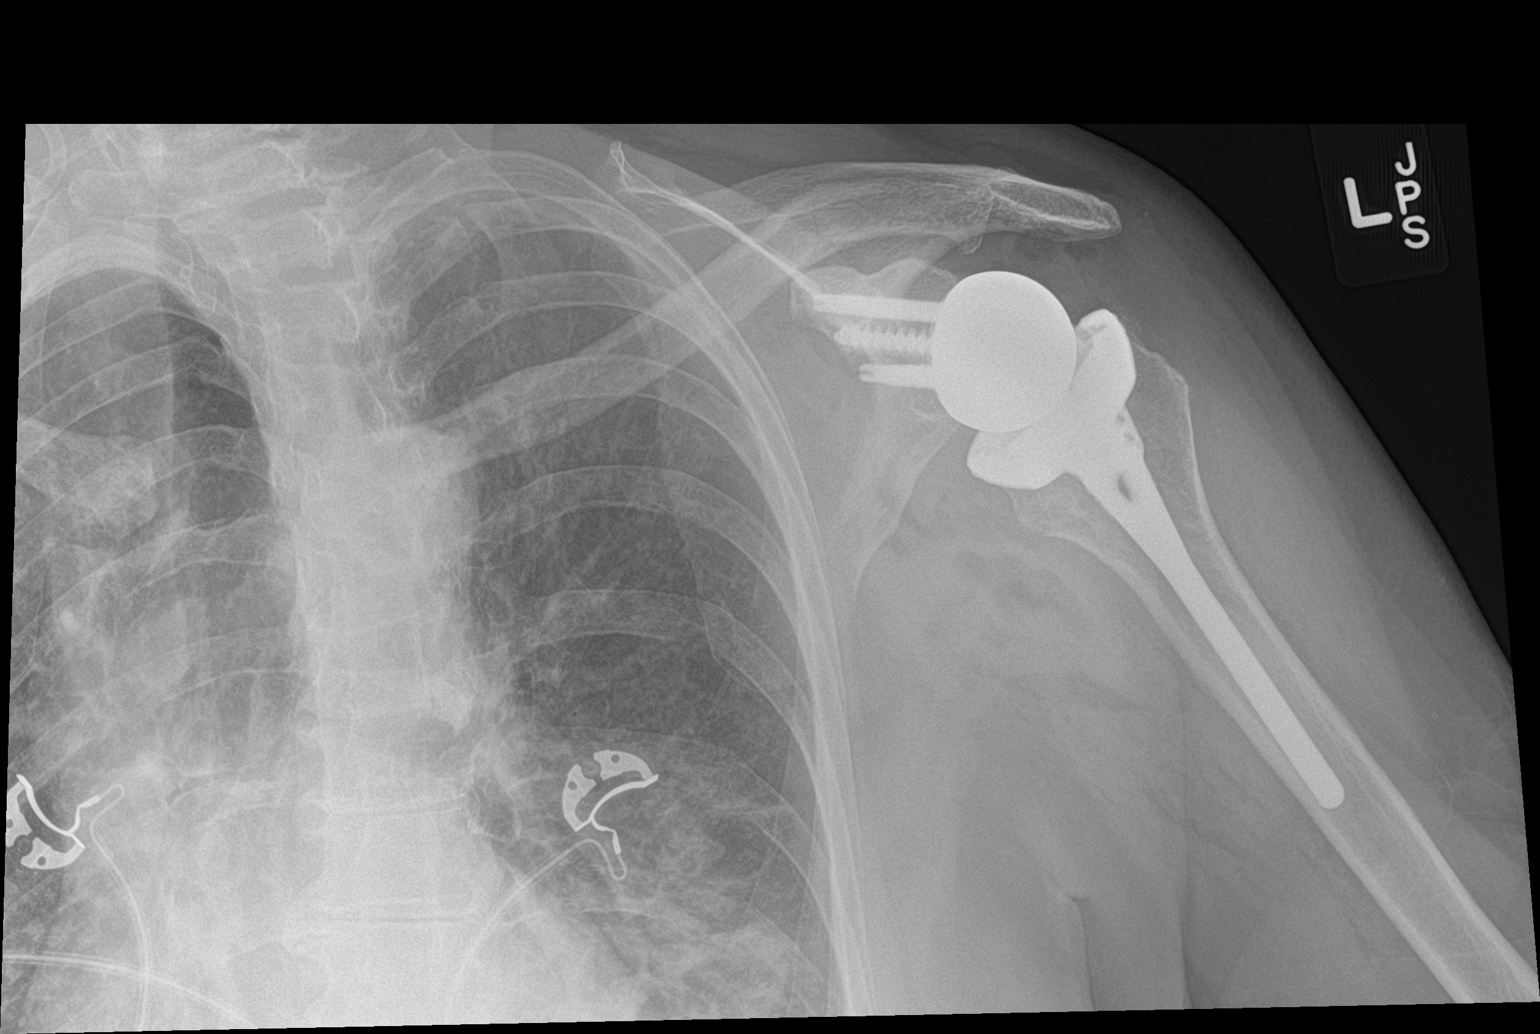

[1 of 1 positions shown; findings below may reference images not displayed]

FINDINGS: A portable view of the left shoulder shows the reverse left total
shoulder arthroplasty components in good position. No complicating
features are seen.
IMPRESSION: Reverse left total shoulder arthroplasty in good position.

## 2017-10-26 ENCOUNTER — Encounter (HOSPITAL_COMMUNITY): Payer: Self-pay | Admitting: Emergency Medicine

## 2017-10-26 ENCOUNTER — Emergency Department (HOSPITAL_COMMUNITY)
Admission: EM | Admit: 2017-10-26 | Discharge: 2017-10-27 | Disposition: A | Discharge status: Home / Self Care | Payer: 59 | Attending: Emergency Medicine | Admitting: Emergency Medicine

## 2017-10-26 DIAGNOSIS — N3000 Acute cystitis without hematuria: Secondary | ICD-10-CM | POA: Diagnosis not present

## 2017-10-26 DIAGNOSIS — Z79899 Other long term (current) drug therapy: Secondary | ICD-10-CM | POA: Diagnosis not present

## 2017-10-26 DIAGNOSIS — Z85048 Personal history of other malignant neoplasm of rectum, rectosigmoid junction, and anus: Secondary | ICD-10-CM | POA: Insufficient documentation

## 2017-10-26 DIAGNOSIS — I110 Hypertensive heart disease with heart failure: Secondary | ICD-10-CM | POA: Diagnosis not present

## 2017-10-26 DIAGNOSIS — R300 Dysuria: Secondary | ICD-10-CM | POA: Diagnosis present

## 2017-10-26 DIAGNOSIS — I509 Heart failure, unspecified: Secondary | ICD-10-CM | POA: Diagnosis not present

## 2017-10-26 LAB — CBC WITH DIFFERENTIAL/PLATELET
BASOS ABS: 0 10*3/uL (ref 0.0–0.1)
Basophils Relative: 0 %
EOS ABS: 0.1 10*3/uL (ref 0.0–0.7)
EOS PCT: 2 %
HCT: 36.2 % (ref 36.0–46.0)
Hemoglobin: 11.4 g/dL — ABNORMAL LOW (ref 12.0–15.0)
LYMPHS ABS: 1.7 10*3/uL (ref 0.7–4.0)
Lymphocytes Relative: 25 %
MCH: 26.5 pg (ref 26.0–34.0)
MCHC: 31.5 g/dL (ref 30.0–36.0)
MCV: 84.2 fL (ref 78.0–100.0)
MONO ABS: 0.9 10*3/uL (ref 0.1–1.0)
Monocytes Relative: 13 %
Neutro Abs: 4.1 10*3/uL (ref 1.7–7.7)
Neutrophils Relative %: 60 %
PLATELETS: 235 10*3/uL (ref 150–400)
RBC: 4.3 MIL/uL (ref 3.87–5.11)
RDW: 16 % — AB (ref 11.5–15.5)
WBC: 6.8 10*3/uL (ref 4.0–10.5)

## 2017-10-26 LAB — URINALYSIS, ROUTINE W REFLEX MICROSCOPIC
BILIRUBIN URINE: NEGATIVE
GLUCOSE, UA: NEGATIVE mg/dL
HGB URINE DIPSTICK: NEGATIVE
KETONES UR: NEGATIVE mg/dL
NITRITE: NEGATIVE
PROTEIN: 100 mg/dL — AB
Specific Gravity, Urine: 1.031 — ABNORMAL HIGH (ref 1.005–1.030)
WBC, UA: >50 WBC/hpf — ABNORMAL HIGH (ref 0–5)
pH: 5 (ref 5.0–8.0)

## 2017-10-26 LAB — BASIC METABOLIC PANEL
Anion gap: 8 (ref 5–15)
BUN: 27 mg/dL — AB (ref 6–20)
CHLORIDE: 104 mmol/L (ref 101–111)
CO2: 28 mmol/L (ref 22–32)
CREATININE: 0.87 mg/dL (ref 0.44–1.00)
Calcium: 9.2 mg/dL (ref 8.9–10.3)
GFR calc Af Amer: >60 mL/min (ref >60)
GLUCOSE: 125 mg/dL — AB (ref 65–99)
POTASSIUM: 3.5 mmol/L (ref 3.5–5.1)
Sodium: 140 mmol/L (ref 135–145)

## 2017-10-26 LAB — WET PREP, GENITAL
Clue Cells Wet Prep HPF POC: NONE SEEN
Sperm: NONE SEEN
Trich, Wet Prep: NONE SEEN
YEAST WET PREP: NONE SEEN

## 2017-10-26 MED ORDER — HYDROCODONE-ACETAMINOPHEN 5-325 MG PO TABS
1.0000 | ORAL_TABLET | Freq: Once | ORAL | Status: AC
  Administered 2017-10-26: 1 via ORAL
  Filled 2017-10-26: qty 1

## 2017-10-26 MED ORDER — NITROFURANTOIN MONOHYD MACRO 100 MG PO CAPS
100.0000 mg | ORAL_CAPSULE | Freq: Once | ORAL | Status: AC
  Administered 2017-10-26: 100 mg via ORAL
  Filled 2017-10-26: qty 1

## 2017-10-26 NOTE — ED Provider Notes (Signed)
Complains of burning on urination at urethral meatus feels like a bladder infection shown the past symptoms onset 3 days ago. She denies nausea vomiting or fever. No other associated symptoms. Treated with Azo, without relief.   Orlie Dakin, MD 10/26/17 [id number]

## 2017-10-26 NOTE — ED Notes (Signed)
Pt with constant burning esp with urinating, denies fevers or N/V

## 2017-10-26 NOTE — ED Triage Notes (Signed)
Patient complaining of burning with urination x 2 days.

## 2017-10-27 DIAGNOSIS — N3000 Acute cystitis without hematuria: Secondary | ICD-10-CM | POA: Diagnosis not present

## 2017-10-27 MED ORDER — HYDROCODONE-ACETAMINOPHEN 5-325 MG PO TABS: 1.0000 | ORAL_TABLET | ORAL | 0 refills | Status: AC | PRN

## 2017-10-27 MED ORDER — CEPHALEXIN 500 MG PO CAPS
500.0000 mg | ORAL_CAPSULE | Freq: Once | ORAL | Status: AC
  Administered 2017-10-27: 500 mg via ORAL
  Filled 2017-10-27: qty 1

## 2017-10-27 MED ORDER — CEPHALEXIN 500 MG PO CAPS: 500.0000 mg | ORAL_CAPSULE | Freq: Four times a day (QID) | ORAL | 0 refills | Status: AC

## 2017-10-27 NOTE — Discharge Instructions (Addendum)
Take your next dose of the antibiotic tomorrow morning.  Make sure you are drinking plenty of fluids.  You Ahn take the hydrocodone prescribed for pain relief.  This will make you drowsy - do not drive within 4 hours of taking this medication. Get rechecked for any worsening symptoms including pain, fever or if you develop vomiting , weakness or any new concerns.

## 2017-10-27 NOTE — ED Provider Notes (Signed)
[facility] EMERGENCY DEPARTMENT Provider Note   CSN: [id number] Arrival date & time: 10/26/17  2041     History   Chief Complaint Chief Complaint  Patient presents with  . Dysuria    HPI Lori Martinez is a 67 y.o. female.  The history is provided by the patient.  Dysuria   This is a new problem. Episode onset: 3 days ago. Episode frequency: constant burning pain at her urethra worsened with urination. The problem has been gradually worsening. The quality of the pain is described as burning. The pain is at a severity of 10/10. The pain is severe. There has been no fever. She is not sexually active. There is no history of pyelonephritis. Associated symptoms include frequency and urgency. Pertinent negatives include no chills, no sweats, no nausea, no vomiting, no discharge, no hematuria and no flank pain. Associated symptoms comments: Describes constant pain and burning in her vagina and urethra. She denies vaginal discharge or rash. . Treatments tried: azo without relief. Her past medical history does not include kidney stones, urological procedure, recurrent UTIs, urinary stasis or catheterization.    Past Medical History:  Diagnosis Date  . Acid reflux   . 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 Arthritis    hands and knees  . Cancer ([place])   . 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 Cataract   . Change in voice   . Chest pain    seen 2010 (Dr. Elisabeth Cara, [facility]) and had low risk stress test, EF 76%.  . CHF (congestive heart failure) ([place])   . Chronic cough   . Diabetes mellitus   . Frequent urination   . Headaches, cluster   . Hearing loss   . Hyperlipidemia   . Hypertension   . Neuropathy, peripheral   . Pneumonia    as a teenager  . Rotator cuff tear arthropathy    left  . Trouble swallowing   . Vomiting    as result of acid reflux   . Wears glasses     Patient Active Problem List   Diagnosis Date Noted  . PUD (peptic ulcer disease) 06/18/2017  . Acute upper GI bleed 04/13/2017  .  Acute blood loss anemia 04/13/2017  . Hypotension 04/13/2017  . History of colorectal cancer 04/13/2017  . History of Nissen fundoplication 24/26/8341  . 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 Cervicalgia 10/07/2015  . Lumbar stenosis with neurogenic claudication 10/20/2014  . CONTUSION OF KNEE 05/26/2008    Past Surgical History:  Procedure Laterality Date  . ABDOMINAL HYSTERECTOMY    . BACK SURGERY  2010  . CHOLECYSTECTOMY    . COLON SURGERY  1991, 1992   colon/rectal cancer  . COLONOSCOPY  2013   By Dr. Collene Mares  . COLONOSCOPY  10/2016   Dr. Collene Mares: healthy anastomosis at 10 cm, rest of colonic mucosa appeared normal, retroflexion not possible due to limited rectal compliance. Repeat in 5 years  . ESOPHAGOGASTRODUODENOSCOPY N/A 04/14/2017   One non-bleeding cratered ulcer with pigmented material in gastric body and on greater curvature of stomach, s/p epi injection and clips X 3. Chronic gastritis   . ESOPHAGOGASTRODUODENOSCOPY N/A 06/28/2017   Procedure: ESOPHAGOGASTRODUODENOSCOPY (EGD);  Surgeon: Danie Binder, MD;  Location: [facility];  Service: Endoscopy;  Laterality: N/A;  1:30pm  . EYE SURGERY    . HERNIA REPAIR     hiatal hernia   . LAPAROSCOPIC NISSEN FUNDOPLICATION  9/62/22  . REVERSE SHOULDER ARTHROPLASTY Left 12/29/2015  Procedure: REVERSE SHOULDER ARTHROPLASTY;  Surgeon: Tania Ade, MD;  Location: [place];  Service: Orthopedics;  Laterality: Left;  Left reverse total shoulder     OB History   None      Home Medications    Prior to Admission medications   Medication Sig Start Date End Date Taking? Authorizing Provider  acetaminophen (TYLENOL) 500 MG tablet Take 1,000 mg by mouth every 6 (six) hours as needed for moderate pain or headache.   Yes [provider]  pregabalin (LYRICA) 75 MG capsule Take 75 mg by mouth 2 (two) times daily.   Yes [provider]  ramipril (ALTACE) 10 MG capsule Take 10 mg by mouth 2 (two) times  daily.   Yes [provider]  cephALEXin (KEFLEX) 500 MG capsule Take 1 capsule (500 mg total) by mouth 4 (four) times daily. 10/27/17   Evalee Jefferson, PA-C  HYDROcodone-acetaminophen (NORCO/VICODIN) 5-325 MG tablet Take 1 tablet by mouth every 4 (four) hours as needed for severe pain. 10/27/17   Evalee Jefferson, PA-C    Family History Family History  Problem Relation Age of Onset  . Stroke Mother   . Heart disease Father   . Heart disease Brother     Social History Social History   Tobacco Use  . Smoking status: Never Smoker  . Smokeless tobacco: Never Used  Substance Use Topics  . Alcohol use: No  . Drug use: No     Allergies   Penicillins and Sulfonamide derivatives   Review of Systems Review of Systems  Constitutional: Negative for chills.  Gastrointestinal: Negative for nausea and vomiting.  Genitourinary: Positive for dysuria, frequency and urgency. Negative for flank pain and hematuria.     Physical Exam Updated Vital Signs BP (!) 102/54   Pulse 74   Temp 98.2 F (36.8 C) (Oral)   Resp 16   Wt 77.1 kg (170 lb)   SpO2 96%   BMI 28.29 kg/m   Physical Exam  Constitutional: She appears well-developed and well-nourished.  HENT:  Head: Normocephalic and atraumatic.  Eyes: Conjunctivae are normal.  Neck: Normal range of motion.  Cardiovascular: Normal rate, regular rhythm, normal heart sounds and intact distal pulses.  Pulmonary/Chest: Effort normal and breath sounds normal. She has no wheezes.  Abdominal: Soft. Bowel sounds are normal. She exhibits no distension. There is no tenderness. There is no guarding.  Genitourinary: There is no rash on the right labia. There is no rash on the left labia. No erythema in the vagina. No vaginal discharge found.  Genitourinary Comments: No rash or lesions.  Musculoskeletal: Normal range of motion.  Neurological: She is alert.  Skin: Skin is warm and dry.  Psychiatric: She has a normal mood and affect.  Nursing  note and vitals reviewed.    ED Treatments / Results  Labs (all labs ordered are listed, but only abnormal results are displayed) Labs Reviewed  WET PREP, GENITAL - Abnormal; Notable for the following components:      Result Value   WBC, Wet Prep HPF POC RARE (*)    All other components within normal limits  URINALYSIS, ROUTINE W REFLEX MICROSCOPIC - Abnormal; Notable for the following components:   APPearance HAZY (*)    Specific Gravity, Urine 1.031 (*)    Protein, ur 100 (*)    Leukocytes, UA TRACE (*)    WBC, UA >50 (*)    Bacteria, UA RARE (*)    Non Squamous Epithelial 0-5 (*)    All  other components within normal limits  BASIC METABOLIC PANEL - Abnormal; Notable for the following components:   Glucose, Bld 125 (*)    BUN 27 (*)    All other components within normal limits  CBC WITH DIFFERENTIAL/PLATELET - Abnormal; Notable for the following components:   Hemoglobin 11.4 (*)    RDW 16.0 (*)    All other components within normal limits  URINE CULTURE    EKG None  Radiology No results found.  Procedures Procedures (including critical care time)  Medications Ordered in ED Medications  cephALEXin (KEFLEX) capsule 500 mg (has no administration in time range)  HYDROcodone-acetaminophen (NORCO/VICODIN) 5-325 MG per tablet 1 tablet (1 tablet Oral Given 10/26/17 2317)  nitrofurantoin (macrocrystal-monohydrate) (MACROBID) capsule 100 mg (100 mg Oral Given 10/26/17 2317)     Initial Impression / Assessment and Plan / ED Course  I have reviewed the triage vital signs and the nursing notes.  Pertinent labs & imaging results that were available during my care of the patient were reviewed by me and considered in my medical decision making (see chart for details).     Pt placed on keflex, hydrocodone short course for burning pain.  Encouraged increased fluid intake, return precautions discussed.  Recheck by pcp x 1 week to ensure resolution. Pt seen by Dr Winfred Leeds prior  to dc.   Pt has no flank pain or h/o nephrolithiasis, doubt renal stone as source of sx.  No pyelo with no flank pain or fever.  Wet prep negative for vaginal source of sx.    Final Clinical Impressions(s) / ED Diagnoses   Final diagnoses:  Acute cystitis without hematuria    ED Discharge Orders        Ordered    cephALEXin (KEFLEX) 500 MG capsule  4 times daily     10/27/17 0008    HYDROcodone-acetaminophen (NORCO/VICODIN) 5-325 MG tablet  Every 4 hours PRN     10/27/17 0008       Evalee Jefferson, PA-C 10/27/17 0018    Orlie Dakin, MD 10/27/17 [phone]

## 2017-10-28 LAB — URINE CULTURE: Culture: NO GROWTH

## 2017-12-09 DIAGNOSIS — R21 Rash and other nonspecific skin eruption: Secondary | ICD-10-CM | POA: Diagnosis not present

## 2017-12-09 DIAGNOSIS — L299 Pruritus, unspecified: Secondary | ICD-10-CM | POA: Diagnosis not present

## 2017-12-09 DIAGNOSIS — E663 Overweight: Secondary | ICD-10-CM | POA: Diagnosis not present

## 2017-12-09 DIAGNOSIS — Z6829 Body mass index (BMI) 29.0-29.9, adult: Secondary | ICD-10-CM | POA: Diagnosis not present

## 2017-12-09 DIAGNOSIS — Z1389 Encounter for screening for other disorder: Secondary | ICD-10-CM | POA: Diagnosis not present

## 2018-06-19 DIAGNOSIS — Z9289 Personal history of other medical treatment: Secondary | ICD-10-CM | POA: Diagnosis not present

## 2018-06-19 DIAGNOSIS — Z85048 Personal history of other malignant neoplasm of rectum, rectosigmoid junction, and anus: Secondary | ICD-10-CM | POA: Diagnosis not present

## 2018-06-19 DIAGNOSIS — C210 Malignant neoplasm of anus, unspecified: Secondary | ICD-10-CM | POA: Diagnosis not present

## 2018-06-19 DIAGNOSIS — Z9221 Personal history of antineoplastic chemotherapy: Secondary | ICD-10-CM | POA: Diagnosis not present

## 2018-06-19 DIAGNOSIS — Z483 Aftercare following surgery for neoplasm: Secondary | ICD-10-CM | POA: Diagnosis not present

## 2018-06-19 DIAGNOSIS — Z8719 Personal history of other diseases of the digestive system: Secondary | ICD-10-CM | POA: Diagnosis not present

## 2018-06-19 DIAGNOSIS — Z1231 Encounter for screening mammogram for malignant neoplasm of breast: Secondary | ICD-10-CM | POA: Diagnosis not present

## 2018-06-19 DIAGNOSIS — C189 Malignant neoplasm of colon, unspecified: Secondary | ICD-10-CM | POA: Diagnosis not present

## 2018-06-19 DIAGNOSIS — K219 Gastro-esophageal reflux disease without esophagitis: Secondary | ICD-10-CM | POA: Diagnosis not present

## 2018-06-24 ENCOUNTER — Encounter: Payer: Self-pay | Admitting: Gastroenterology

## 2019-06-25 DIAGNOSIS — Z86010 Personal history of colonic polyps: Secondary | ICD-10-CM | POA: Diagnosis not present

## 2019-06-25 DIAGNOSIS — Z9289 Personal history of other medical treatment: Secondary | ICD-10-CM | POA: Diagnosis not present

## 2019-06-25 DIAGNOSIS — C210 Malignant neoplasm of anus, unspecified: Secondary | ICD-10-CM | POA: Diagnosis not present

## 2019-06-25 DIAGNOSIS — Z8639 Personal history of other endocrine, nutritional and metabolic disease: Secondary | ICD-10-CM | POA: Diagnosis not present

## 2019-06-25 DIAGNOSIS — Z9049 Acquired absence of other specified parts of digestive tract: Secondary | ICD-10-CM | POA: Diagnosis not present

## 2019-06-25 DIAGNOSIS — C189 Malignant neoplasm of colon, unspecified: Secondary | ICD-10-CM | POA: Diagnosis not present

## 2019-06-25 DIAGNOSIS — Z85048 Personal history of other malignant neoplasm of rectum, rectosigmoid junction, and anus: Secondary | ICD-10-CM | POA: Diagnosis not present

## 2019-06-25 DIAGNOSIS — R928 Other abnormal and inconclusive findings on diagnostic imaging of breast: Secondary | ICD-10-CM | POA: Diagnosis not present

## 2019-06-25 DIAGNOSIS — Z85038 Personal history of other malignant neoplasm of large intestine: Secondary | ICD-10-CM | POA: Diagnosis not present

## 2019-06-25 DIAGNOSIS — Z08 Encounter for follow-up examination after completed treatment for malignant neoplasm: Secondary | ICD-10-CM | POA: Diagnosis not present

## 2019-06-25 DIAGNOSIS — Z8719 Personal history of other diseases of the digestive system: Secondary | ICD-10-CM | POA: Diagnosis not present

## 2019-06-25 DIAGNOSIS — Z9889 Other specified postprocedural states: Secondary | ICD-10-CM | POA: Diagnosis not present

## 2020-06-06 DIAGNOSIS — B351 Tinea unguium: Secondary | ICD-10-CM | POA: Diagnosis not present

## 2020-06-06 DIAGNOSIS — E1151 Type 2 diabetes mellitus with diabetic peripheral angiopathy without gangrene: Secondary | ICD-10-CM | POA: Diagnosis not present

## 2020-06-30 DIAGNOSIS — Z9889 Other specified postprocedural states: Secondary | ICD-10-CM | POA: Diagnosis not present

## 2020-06-30 DIAGNOSIS — Z8719 Personal history of other diseases of the digestive system: Secondary | ICD-10-CM | POA: Diagnosis not present

## 2020-06-30 DIAGNOSIS — Z85038 Personal history of other malignant neoplasm of large intestine: Secondary | ICD-10-CM | POA: Diagnosis not present

## 2020-06-30 DIAGNOSIS — C210 Malignant neoplasm of anus, unspecified: Secondary | ICD-10-CM | POA: Diagnosis not present

## 2020-06-30 DIAGNOSIS — Z1231 Encounter for screening mammogram for malignant neoplasm of breast: Secondary | ICD-10-CM | POA: Diagnosis not present

## 2020-06-30 DIAGNOSIS — Z8639 Personal history of other endocrine, nutritional and metabolic disease: Secondary | ICD-10-CM | POA: Diagnosis not present

## 2020-06-30 DIAGNOSIS — Z9884 Bariatric surgery status: Secondary | ICD-10-CM | POA: Diagnosis not present

## 2020-08-01 DIAGNOSIS — R42 Dizziness and giddiness: Secondary | ICD-10-CM | POA: Diagnosis not present

## 2020-08-01 DIAGNOSIS — Z681 Body mass index (BMI) 19 or less, adult: Secondary | ICD-10-CM | POA: Diagnosis not present

## 2020-08-01 DIAGNOSIS — J329 Chronic sinusitis, unspecified: Secondary | ICD-10-CM | POA: Diagnosis not present

## 2020-08-01 DIAGNOSIS — R1110 Vomiting, unspecified: Secondary | ICD-10-CM | POA: Diagnosis not present

## 2020-08-15 DIAGNOSIS — E1151 Type 2 diabetes mellitus with diabetic peripheral angiopathy without gangrene: Secondary | ICD-10-CM | POA: Diagnosis not present

## 2020-08-15 DIAGNOSIS — B351 Tinea unguium: Secondary | ICD-10-CM | POA: Diagnosis not present

## 2020-10-24 DIAGNOSIS — E1151 Type 2 diabetes mellitus with diabetic peripheral angiopathy without gangrene: Secondary | ICD-10-CM | POA: Diagnosis not present

## 2020-10-24 DIAGNOSIS — B351 Tinea unguium: Secondary | ICD-10-CM | POA: Diagnosis not present

## 2021-08-24 DIAGNOSIS — Z85048 Personal history of other malignant neoplasm of rectum, rectosigmoid junction, and anus: Secondary | ICD-10-CM | POA: Diagnosis not present

## 2021-08-24 DIAGNOSIS — Z1231 Encounter for screening mammogram for malignant neoplasm of breast: Secondary | ICD-10-CM | POA: Diagnosis not present

## 2021-08-24 DIAGNOSIS — Z9049 Acquired absence of other specified parts of digestive tract: Secondary | ICD-10-CM | POA: Diagnosis not present

## 2021-08-24 DIAGNOSIS — Z85038 Personal history of other malignant neoplasm of large intestine: Secondary | ICD-10-CM | POA: Diagnosis not present

## 2021-08-24 DIAGNOSIS — Z08 Encounter for follow-up examination after completed treatment for malignant neoplasm: Secondary | ICD-10-CM | POA: Diagnosis not present

## 2021-08-24 DIAGNOSIS — Z9221 Personal history of antineoplastic chemotherapy: Secondary | ICD-10-CM | POA: Diagnosis not present

## 2023-04-02 DIAGNOSIS — Z6820 Body mass index (BMI) 20.0-20.9, adult: Secondary | ICD-10-CM | POA: Diagnosis not present

## 2023-04-02 DIAGNOSIS — R269 Unspecified abnormalities of gait and mobility: Secondary | ICD-10-CM | POA: Diagnosis not present

## 2023-06-13 DIAGNOSIS — C210 Malignant neoplasm of anus, unspecified: Secondary | ICD-10-CM | POA: Diagnosis not present

## 2023-06-13 DIAGNOSIS — Z9889 Other specified postprocedural states: Secondary | ICD-10-CM | POA: Diagnosis not present

## 2023-06-13 DIAGNOSIS — Z1231 Encounter for screening mammogram for malignant neoplasm of breast: Secondary | ICD-10-CM | POA: Diagnosis not present

## 2023-06-13 DIAGNOSIS — C189 Malignant neoplasm of colon, unspecified: Secondary | ICD-10-CM | POA: Diagnosis not present

## 2023-08-15 DIAGNOSIS — H524 Presbyopia: Secondary | ICD-10-CM | POA: Diagnosis not present

## 2023-08-15 DIAGNOSIS — H353111 Nonexudative age-related macular degeneration, right eye, early dry stage: Secondary | ICD-10-CM | POA: Diagnosis not present

## 2023-11-30 DIAGNOSIS — S0990XA Unspecified injury of head, initial encounter: Secondary | ICD-10-CM | POA: Diagnosis not present

## 2023-11-30 DIAGNOSIS — R079 Chest pain, unspecified: Secondary | ICD-10-CM | POA: Diagnosis not present

## 2023-11-30 DIAGNOSIS — Z882 Allergy status to sulfonamides status: Secondary | ICD-10-CM | POA: Diagnosis not present

## 2023-11-30 DIAGNOSIS — M542 Cervicalgia: Secondary | ICD-10-CM | POA: Diagnosis not present

## 2023-11-30 DIAGNOSIS — M549 Dorsalgia, unspecified: Secondary | ICD-10-CM | POA: Diagnosis not present

## 2023-11-30 DIAGNOSIS — R58 Hemorrhage, not elsewhere classified: Secondary | ICD-10-CM | POA: Diagnosis not present

## 2023-11-30 DIAGNOSIS — R109 Unspecified abdominal pain: Secondary | ICD-10-CM | POA: Diagnosis not present

## 2023-11-30 DIAGNOSIS — S2241XA Multiple fractures of ribs, right side, initial encounter for closed fracture: Secondary | ICD-10-CM | POA: Diagnosis not present

## 2023-11-30 DIAGNOSIS — R410 Disorientation, unspecified: Secondary | ICD-10-CM | POA: Diagnosis not present

## 2023-11-30 DIAGNOSIS — Z880 Allergy status to penicillin: Secondary | ICD-10-CM | POA: Diagnosis not present

## 2023-11-30 DIAGNOSIS — T07XXXA Unspecified multiple injuries, initial encounter: Secondary | ICD-10-CM | POA: Diagnosis not present

## 2023-12-10 DIAGNOSIS — H35721 Serous detachment of retinal pigment epithelium, right eye: Secondary | ICD-10-CM | POA: Diagnosis not present

## 2023-12-10 DIAGNOSIS — H35342 Macular cyst, hole, or pseudohole, left eye: Secondary | ICD-10-CM | POA: Diagnosis not present

## 2023-12-10 DIAGNOSIS — H353211 Exudative age-related macular degeneration, right eye, with active choroidal neovascularization: Secondary | ICD-10-CM | POA: Diagnosis not present

## 2023-12-10 DIAGNOSIS — H353124 Nonexudative age-related macular degeneration, left eye, advanced atrophic with subfoveal involvement: Secondary | ICD-10-CM | POA: Diagnosis not present

## 2023-12-10 DIAGNOSIS — H59812 Chorioretinal scars after surgery for detachment, left eye: Secondary | ICD-10-CM | POA: Diagnosis not present

## 2023-12-10 DIAGNOSIS — H35363 Drusen (degenerative) of macula, bilateral: Secondary | ICD-10-CM | POA: Diagnosis not present

## 2023-12-18 DIAGNOSIS — H353211 Exudative age-related macular degeneration, right eye, with active choroidal neovascularization: Secondary | ICD-10-CM | POA: Diagnosis not present

## 2023-12-23 DIAGNOSIS — C189 Malignant neoplasm of colon, unspecified: Secondary | ICD-10-CM | POA: Diagnosis not present

## 2023-12-23 DIAGNOSIS — C210 Malignant neoplasm of anus, unspecified: Secondary | ICD-10-CM | POA: Diagnosis not present

## 2023-12-26 DIAGNOSIS — K219 Gastro-esophageal reflux disease without esophagitis: Secondary | ICD-10-CM | POA: Diagnosis not present

## 2023-12-26 DIAGNOSIS — C19 Malignant neoplasm of rectosigmoid junction: Secondary | ICD-10-CM | POA: Diagnosis not present

## 2023-12-26 DIAGNOSIS — R5382 Chronic fatigue, unspecified: Secondary | ICD-10-CM | POA: Diagnosis not present

## 2023-12-26 DIAGNOSIS — M4216 Adult osteochondrosis of spine, lumbar region: Secondary | ICD-10-CM | POA: Diagnosis not present

## 2023-12-26 DIAGNOSIS — D509 Iron deficiency anemia, unspecified: Secondary | ICD-10-CM | POA: Diagnosis not present

## 2023-12-26 DIAGNOSIS — R634 Abnormal weight loss: Secondary | ICD-10-CM | POA: Diagnosis not present

## 2023-12-26 DIAGNOSIS — Z79899 Other long term (current) drug therapy: Secondary | ICD-10-CM | POA: Diagnosis not present

## 2023-12-26 DIAGNOSIS — E119 Type 2 diabetes mellitus without complications: Secondary | ICD-10-CM | POA: Diagnosis not present

## 2023-12-26 DIAGNOSIS — E1142 Type 2 diabetes mellitus with diabetic polyneuropathy: Secondary | ICD-10-CM | POA: Diagnosis not present

## 2024-01-09 DIAGNOSIS — M129 Arthropathy, unspecified: Secondary | ICD-10-CM | POA: Diagnosis not present

## 2024-01-09 DIAGNOSIS — Z85038 Personal history of other malignant neoplasm of large intestine: Secondary | ICD-10-CM | POA: Diagnosis not present

## 2024-01-15 DIAGNOSIS — H353211 Exudative age-related macular degeneration, right eye, with active choroidal neovascularization: Secondary | ICD-10-CM | POA: Diagnosis not present

## 2024-01-17 DIAGNOSIS — Z1339 Encounter for screening examination for other mental health and behavioral disorders: Secondary | ICD-10-CM | POA: Diagnosis not present

## 2024-01-17 DIAGNOSIS — Z136 Encounter for screening for cardiovascular disorders: Secondary | ICD-10-CM | POA: Diagnosis not present

## 2024-01-17 DIAGNOSIS — Z1331 Encounter for screening for depression: Secondary | ICD-10-CM | POA: Diagnosis not present

## 2024-02-19 DIAGNOSIS — Z85038 Personal history of other malignant neoplasm of large intestine: Secondary | ICD-10-CM | POA: Diagnosis not present

## 2024-02-19 DIAGNOSIS — Z1211 Encounter for screening for malignant neoplasm of colon: Secondary | ICD-10-CM | POA: Diagnosis not present
